# Patient Record
Sex: Male | Born: 1950 | Race: White | Hispanic: No | Smoking: Former smoker
Health system: Southern US, Community
[De-identification: ages and names within clinical notes are randomized; demographics above are authoritative.]

## PROBLEM LIST (undated history)

## (undated) DIAGNOSIS — M1990 Unspecified osteoarthritis, unspecified site: Secondary | ICD-10-CM

## (undated) DIAGNOSIS — K219 Gastro-esophageal reflux disease without esophagitis: Secondary | ICD-10-CM

## (undated) DIAGNOSIS — G4733 Obstructive sleep apnea (adult) (pediatric): Secondary | ICD-10-CM

## (undated) DIAGNOSIS — I10 Essential (primary) hypertension: Secondary | ICD-10-CM

## (undated) DIAGNOSIS — J309 Allergic rhinitis, unspecified: Secondary | ICD-10-CM

## (undated) DIAGNOSIS — Z9989 Dependence on other enabling machines and devices: Secondary | ICD-10-CM

## (undated) DIAGNOSIS — G43909 Migraine, unspecified, not intractable, without status migrainosus: Secondary | ICD-10-CM

## (undated) HISTORY — DX: Gastro-esophageal reflux disease without esophagitis: K21.9

## (undated) HISTORY — DX: Obstructive sleep apnea (adult) (pediatric): G47.33

## (undated) HISTORY — DX: Migraine, unspecified, not intractable, without status migrainosus: G43.909

## (undated) HISTORY — DX: Allergic rhinitis, unspecified: J30.9

## (undated) HISTORY — PX: COLONOSCOPY: SHX174

## (undated) HISTORY — PX: TONSILLECTOMY: SUR1361

## (undated) HISTORY — DX: Dependence on other enabling machines and devices: Z99.89

---
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
## 2002-11-04 ENCOUNTER — Ambulatory Visit (HOSPITAL_COMMUNITY): Admission: RE | Admit: 2002-11-04 | Discharge: 2002-11-04 | Payer: Self-pay | Admitting: Gastroenterology

## 2008-03-17 ENCOUNTER — Ambulatory Visit (HOSPITAL_COMMUNITY): Admission: RE | Admit: 2008-03-17 | Discharge: 2008-03-17 | Payer: Self-pay | Admitting: Orthopedic Surgery

## 2008-06-26 ENCOUNTER — Ambulatory Visit: Payer: Self-pay | Admitting: Internal Medicine

## 2008-06-26 DIAGNOSIS — G4733 Obstructive sleep apnea (adult) (pediatric): Secondary | ICD-10-CM | POA: Insufficient documentation

## 2008-07-12 DIAGNOSIS — K219 Gastro-esophageal reflux disease without esophagitis: Secondary | ICD-10-CM | POA: Insufficient documentation

## 2008-07-12 DIAGNOSIS — J309 Allergic rhinitis, unspecified: Secondary | ICD-10-CM | POA: Insufficient documentation

## 2008-07-12 DIAGNOSIS — E785 Hyperlipidemia, unspecified: Secondary | ICD-10-CM | POA: Insufficient documentation

## 2008-07-20 ENCOUNTER — Encounter: Payer: Self-pay | Admitting: Internal Medicine

## 2008-07-20 ENCOUNTER — Ambulatory Visit (HOSPITAL_BASED_OUTPATIENT_CLINIC_OR_DEPARTMENT_OTHER): Admission: RE | Admit: 2008-07-20 | Discharge: 2008-07-20 | Payer: Self-pay | Admitting: Internal Medicine

## 2008-07-21 ENCOUNTER — Telehealth: Payer: Self-pay | Admitting: Internal Medicine

## 2008-07-30 ENCOUNTER — Ambulatory Visit (HOSPITAL_BASED_OUTPATIENT_CLINIC_OR_DEPARTMENT_OTHER): Admission: RE | Admit: 2008-07-30 | Discharge: 2008-07-30 | Payer: Self-pay | Admitting: Internal Medicine

## 2008-07-30 ENCOUNTER — Encounter: Payer: Self-pay | Admitting: Internal Medicine

## 2008-08-02 ENCOUNTER — Ambulatory Visit: Payer: Self-pay | Admitting: Internal Medicine

## 2008-08-04 ENCOUNTER — Ambulatory Visit: Payer: Self-pay | Admitting: Internal Medicine

## 2008-08-07 ENCOUNTER — Encounter: Payer: Self-pay | Admitting: Internal Medicine

## 2008-08-27 ENCOUNTER — Encounter: Payer: Self-pay | Admitting: Internal Medicine

## 2008-09-12 ENCOUNTER — Ambulatory Visit: Payer: Self-pay | Admitting: Internal Medicine

## 2008-10-01 ENCOUNTER — Encounter: Payer: Self-pay | Admitting: Internal Medicine

## 2008-10-30 ENCOUNTER — Encounter: Payer: Self-pay | Admitting: Internal Medicine

## 2009-06-09 ENCOUNTER — Encounter: Admission: RE | Admit: 2009-06-09 | Discharge: 2009-06-09 | Payer: Self-pay | Admitting: Cardiology

## 2009-07-30 ENCOUNTER — Ambulatory Visit: Payer: Self-pay | Admitting: Internal Medicine

## 2010-07-09 ENCOUNTER — Encounter: Admission: RE | Admit: 2010-07-09 | Discharge: 2010-07-09 | Payer: Self-pay | Admitting: Family Medicine

## 2010-07-10 ENCOUNTER — Emergency Department (HOSPITAL_BASED_OUTPATIENT_CLINIC_OR_DEPARTMENT_OTHER): Admission: EM | Admit: 2010-07-10 | Discharge: 2010-07-10 | Payer: Self-pay | Admitting: Emergency Medicine

## 2010-07-12 ENCOUNTER — Telehealth (INDEPENDENT_AMBULATORY_CARE_PROVIDER_SITE_OTHER): Payer: Self-pay | Admitting: *Deleted

## 2010-07-19 ENCOUNTER — Ambulatory Visit: Payer: Self-pay | Admitting: Internal Medicine

## 2010-07-19 DIAGNOSIS — J811 Chronic pulmonary edema: Secondary | ICD-10-CM | POA: Insufficient documentation

## 2010-07-20 ENCOUNTER — Telehealth (INDEPENDENT_AMBULATORY_CARE_PROVIDER_SITE_OTHER): Payer: Self-pay | Admitting: *Deleted

## 2010-09-07 NOTE — Progress Notes (Signed)
Summary: dxd with pna-LMTCB x 1  Phone Note Call from Patient Call back at Home Phone [phone]   Caller: Patient Summary of Call: Pt states he was seen on Saturday at [facility] in [facility] and was diagnosed with pna.  He would like to know if he should still be sleeping with cpap with pna and if the cpap could have some bacteria in it that could be causing the pna.  Pt states he is using ivory soap in the reservoir and mask to clean it approx every 1-2 wks.  Dr. Maple Hudson, pls advise.  Thanks! Initial call taken by: Gweneth Dimitri RN,  July 12, 2010 2:59 PM  Follow-up for Phone Call        I am not concerned that the CPAP would cause pneumonia and the filtered air is actually a help.  Follow-up by: Waymon Budge MD,  July 12, 2010 3:03 PM  Additional Follow-up for Phone Call Additional follow up Details #1::        LMOMTCB James Zhang  July 12, 2010 3:28 PM pt aware of dr Roxy Cedar response and has ov with cy next week Additional Follow-up by: Philipp Deputy CMA,  July 13, 2010 5:08 PM

## 2010-09-09 NOTE — Assessment & Plan Note (Signed)
Summary: yearly f/u / cj   Copy to:  SELF Primary Provider/Referring Provider:  Marjory Lies  CC:  Yearly follow up visit-Sleep Apnea; not using CPAP regularly sue to recent PNA x 2 weeks. "sleeping all the time"..  History of Present Illness: 08/04/08-OSA NPSG had confirmed Mild to Moderate OSA, AHI 15.6 0n 07/20/08. Implications, driving, weight and treatment options were discussed by phone and cpap titration accomplished. Excellent control at 16 cwp, AHI 0/hr.  09/12/08- OSA  Wife here Doing very well with cpap. Adapted too it right away. Mask comfortable. Excellent compliance. Settled on a mask that will do. Much less daytime sleepiness. We talked again about medical goals, compliance, comfort. Still breathes some through mouth. Less nocturia. Discussed impact of sleep apnea on BP. Discussed  Weight, difficulty of airway intubation.  July 30, 2009- OSA.............................Marland Kitchenbrother in law here His wife died in her sleep. He uses her machine now as a spare, still set on 16. Humidifer doesn't keep him from getting dry mouth. He had nuclear stress test with Dr Patty Sermons- RBBB.   July 19, 2010-  OSA.. Nurse-CC: Yearly follow up visit-Sleep Apnea; not using CPAP regularly sue to recent PNA x 2 weeks. "sleeping all the time". Cold from grandson turned into a pneumonia on CXR  treated at [facility] and his PCP with [facility]. Cough was mostly dry. Had sweats. Still has head congestion, ears stopped up. Neti pot helps. Finsihed Avelox. Has been unable to tolerated CPAP through this but was using it every night before. Denies any leg pain/ cramp/ swelling.     Preventive Screening-Counseling & Management  Alcohol-Tobacco     Smoking Status: quit     Packs/Day: 1.0     Year Started: age 32     Year Quit: age 56  Current Medications (verified): 1)  Strattera 60 Mg Caps (Atomoxetine Hcl) .... Take 1 By Mouth Two Times A Day 2)  Simvastatin 80 Mg Tabs (Simvastatin) .... Take  1 By Mouth Once Daily 3)  Imitrex 100 Mg Tabs (Sumatriptan Succinate) .... Take 1 As Needed Migraines 4)  Omeprazole 20 Mg  Cpdr (Omeprazole) .... By Mouth Daily. Take One Half Hour Before Eating. 5)  Cpap  16 Sms 6)  Toprol Xl 25 Mg Xr24h-Tab (Metoprolol Succinate) .... Take 1 By Mouth Once Daily 7)  Clarinex 5 Mg Tabs (Desloratadine) .... Take 1 By Mouth Once Daily As Needed 8)  Lisinopril 10 Mg Tabs (Lisinopril) .... Take 1 By Mouth Once Daily  Allergies (verified): No Known Drug Allergies  Past History:  Past Surgical History: Last updated: 06/26/2008 Tonsillectomy Knee cartilage 1068  Family History: Last updated: 06/26/2008 Father- restless legs Mother- arrythmia  Social History: Last updated: 06/26/2008 Patient states former smoker. 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 CPAP 07/30/08- 16cwp/ AHI 0/hr. Pneumonia CAP 2011  Social History: Packs/Day:  1.0  Review of Systems      See HPI       The patient complains of nasal congestion/difficulty breathing through nose.  The patient denies shortness of breath with activity, shortness of breath at rest, productive cough, non-productive cough, coughing up blood, chest pain, irregular heartbeats, acid heartburn, indigestion, loss of appetite, weight change, abdominal pain, difficulty swallowing, sore throat, tooth/dental problems, headaches, and sneezing.  Denies syncope, palpitation, calf pain. Feels sleepy now.   Vital Signs:  Patient profile:   60 year old male Height:      70 inches Weight:      255 pounds BMI:     36.72 O2 Sat:      96 % on Room air Pulse rate:   86 / minute BP sitting:   90 / 52  (left arm) Cuff  size:   large  Vitals Entered By: Reynaldo Minium CMA (July 19, 2010 2:26 PM)  O2 Flow:  Room air CC: Yearly follow up visit-Sleep Apnea; not using CPAP regularly sue to recent PNA x 2 weeks. "sleeping all the time".   Physical Exam  Additional Exam:  General: A/Ox3; pleasant and cooperative, NAD, obese   96 % sat on RA. Note BP 90/52. At home his own cuff gets  SKIN: no rash, lesions NODES: no lymphadenopathy HEENT: [place]/AT, EOM- WNL, Conjuctivae- clear, PERRLA, TM-WNL, Nose- clear, Throat- clear and wnl, Mallampati  III-IV NECK: Supple w/ fair ROM, JVD- none, normal carotid impulses w/o bruits Thyroid- normal to palpation CHEST: Clear to P&A, no cough, rales or rhonchi HEART: RRR, no m/g/r heard ABDOMEN: Soft and nl; nml bowel sounds; no organomegaly or masses noted YNW:GNFA, nl pulses, no edema  NEURO: Grossly intact to observation      Impression & Recommendations:  Problem # 1:  SLEEP APNEA (ICD-780.57)  Encourage resumption of regular CPAP as he recovers from the pneumonia. He has residual rhinitis with head congestion and we will let him try a neb here.   Problem # 2:  PULMONARY CONGESTION AND HYPOSTASIS (ICD-514) Recent acute illness treated as pneumonia. He is improving now.   Medications Added to Medication List This Visit: 1)  Lisinopril 10 Mg Tabs (Lisinopril) .... Take 1 by mouth once daily  Other Orders: Est. Patient Level IV (21308) Nebulizer Tx (65784)  Patient Instructions: 1)  Please schedule a follow-up appointment in 6 months. 2)  Please try to get back on your CPAP. If you have trouble with it let us know.  3)  Neb neo nasal 4)  Consider otc decongestant phenylephrine/ PE (Sudafed-PE and others).      Medication Administration  Medication # 1:    Medication: EMR miscellaneous medications    Diagnosis: ALLERGIC RHINITIS (ICD-477.9)    Dose: 3 drops    Route: intranasal    Exp Date: 11/2011    Lot #: [id number]    Mfr: Bayer    Comments:  Neo-Synephrine    Patient tolerated medication without complications    Given by: Reynaldo Minium CMA (July 19, 2010 2:55 PM)  Orders Added: 1)  Est. Patient Level IV [84132] 2)  Nebulizer Tx [phone]

## 2010-09-09 NOTE — Progress Notes (Signed)
Summary: talk to nurse- returning call  Phone Note Call from Patient Call back at Home Phone [phone]   Caller: Patient Call For: James Zhang Summary of Call: Pt states he saw CY yesterday and he was supposed to give him the name of OTC decongestant and he was supposed to give him something for the yeast in his throat pls advise.//[facility] [address] Initial call taken by: Darletta Moll,  July 20, 2010 8:20 AM  Follow-up for Phone Call        Per pt instructions from yesterday, pt should consider OTC decongestant phenylphrine/PE (Sudafed-PE and others). [facility]  Crystal Jones RN  July 20, 2010 9:15 AM   Additional Follow-up for Phone Call Additional follow up Details #1::        Returning triage's call.   James Zhang  July 20, 2010 2:53 PM    Additional Follow-up for Phone Call Additional follow up Details #2::    Spoke with pt.  I advised that CDY wanted him to try sudafed PE OTC.  He verbalized understanding.  He states that CDY had also mentioned that he saw some yeast in the back of his throat and wanted him to have something for that.  Nothing was sent to pharm.  Pls advise thanks! Follow-up by: Vernie Murders,  July 20, 2010 4:18 PM  Additional Follow-up for Phone Call Additional follow up Details #3:: Details for Additional Follow-up Action Taken: Per CDY-give Dukes Magic Mouthwash #156ml swish and swallow 1 tablespoon three times a day no refills.Reynaldo Minium CMA  July 20, 2010 5:23 PM     Pt aware that RX was called in to pharmacy.Reynaldo Minium CMA  July 20, 2010 5:23 PM   New/Updated Medications: FIRST-DUKES MOUTHWASH  SUSP (DIPHENHYD-HYDROCORT-NYSTATIN) swish and swallow 1 tablespoon three times a day Prescriptions: FIRST-DUKES MOUTHWASH  SUSP (DIPHENHYD-HYDROCORT-NYSTATIN) swish and swallow 1 tablespoon three times a day  #140ml x 0   Entered by:   Reynaldo Minium CMA   Authorized by:   Waymon Budge MD   Signed by:   Reynaldo Minium CMA on  07/20/2010   Method used:   Telephoned to ...       [facility] [address] [facility]ail[facility]       [address]       [place], [place]  [address]       Ph: [phone] or [phone]       Fax: [phone]   RxID:   [id number]

## 2010-10-19 LAB — DIFFERENTIAL
Basophils Relative: 1 % (ref 0–1)
Lymphocytes Relative: 8 % — ABNORMAL LOW (ref 12–46)
Lymphs Abs: 0.6 10*3/uL — ABNORMAL LOW (ref 0.7–4.0)
Neutrophils Relative %: 79 % — ABNORMAL HIGH (ref 43–77)

## 2010-10-19 LAB — CULTURE, BLOOD (ROUTINE X 2)
Culture: NO GROWTH
Culture: NO GROWTH

## 2010-10-19 LAB — URINALYSIS, ROUTINE W REFLEX MICROSCOPIC
Bilirubin Urine: NEGATIVE
Glucose, UA: NEGATIVE mg/dL
Hgb urine dipstick: NEGATIVE
Ketones, ur: NEGATIVE mg/dL
Specific Gravity, Urine: 1.029 (ref 1.005–1.030)
Urobilinogen, UA: 1 mg/dL (ref 0.0–1.0)
pH: 5.5 (ref 5.0–8.0)

## 2010-10-19 LAB — CBC
MCH: 32.1 pg (ref 26.0–34.0)
Platelets: 219 10*3/uL (ref 150–400)
RBC: 5.16 MIL/uL (ref 4.22–5.81)
RDW: 12.4 % (ref 11.5–15.5)

## 2010-10-19 LAB — BASIC METABOLIC PANEL
BUN: 20 mg/dL (ref 6–23)
GFR calc Af Amer: >60 mL/min (ref >60)
GFR calc non Af Amer: >60 mL/min (ref >60)

## 2010-10-27 IMAGING — CR DG CHEST 2V
2 series · 2 of 2 positions shown · non-contrast
Comparison: None.

CLINICAL DATA: Abnormal ekg.  No chest complaints.

CHEST - 2 VIEW

[w chest pa]
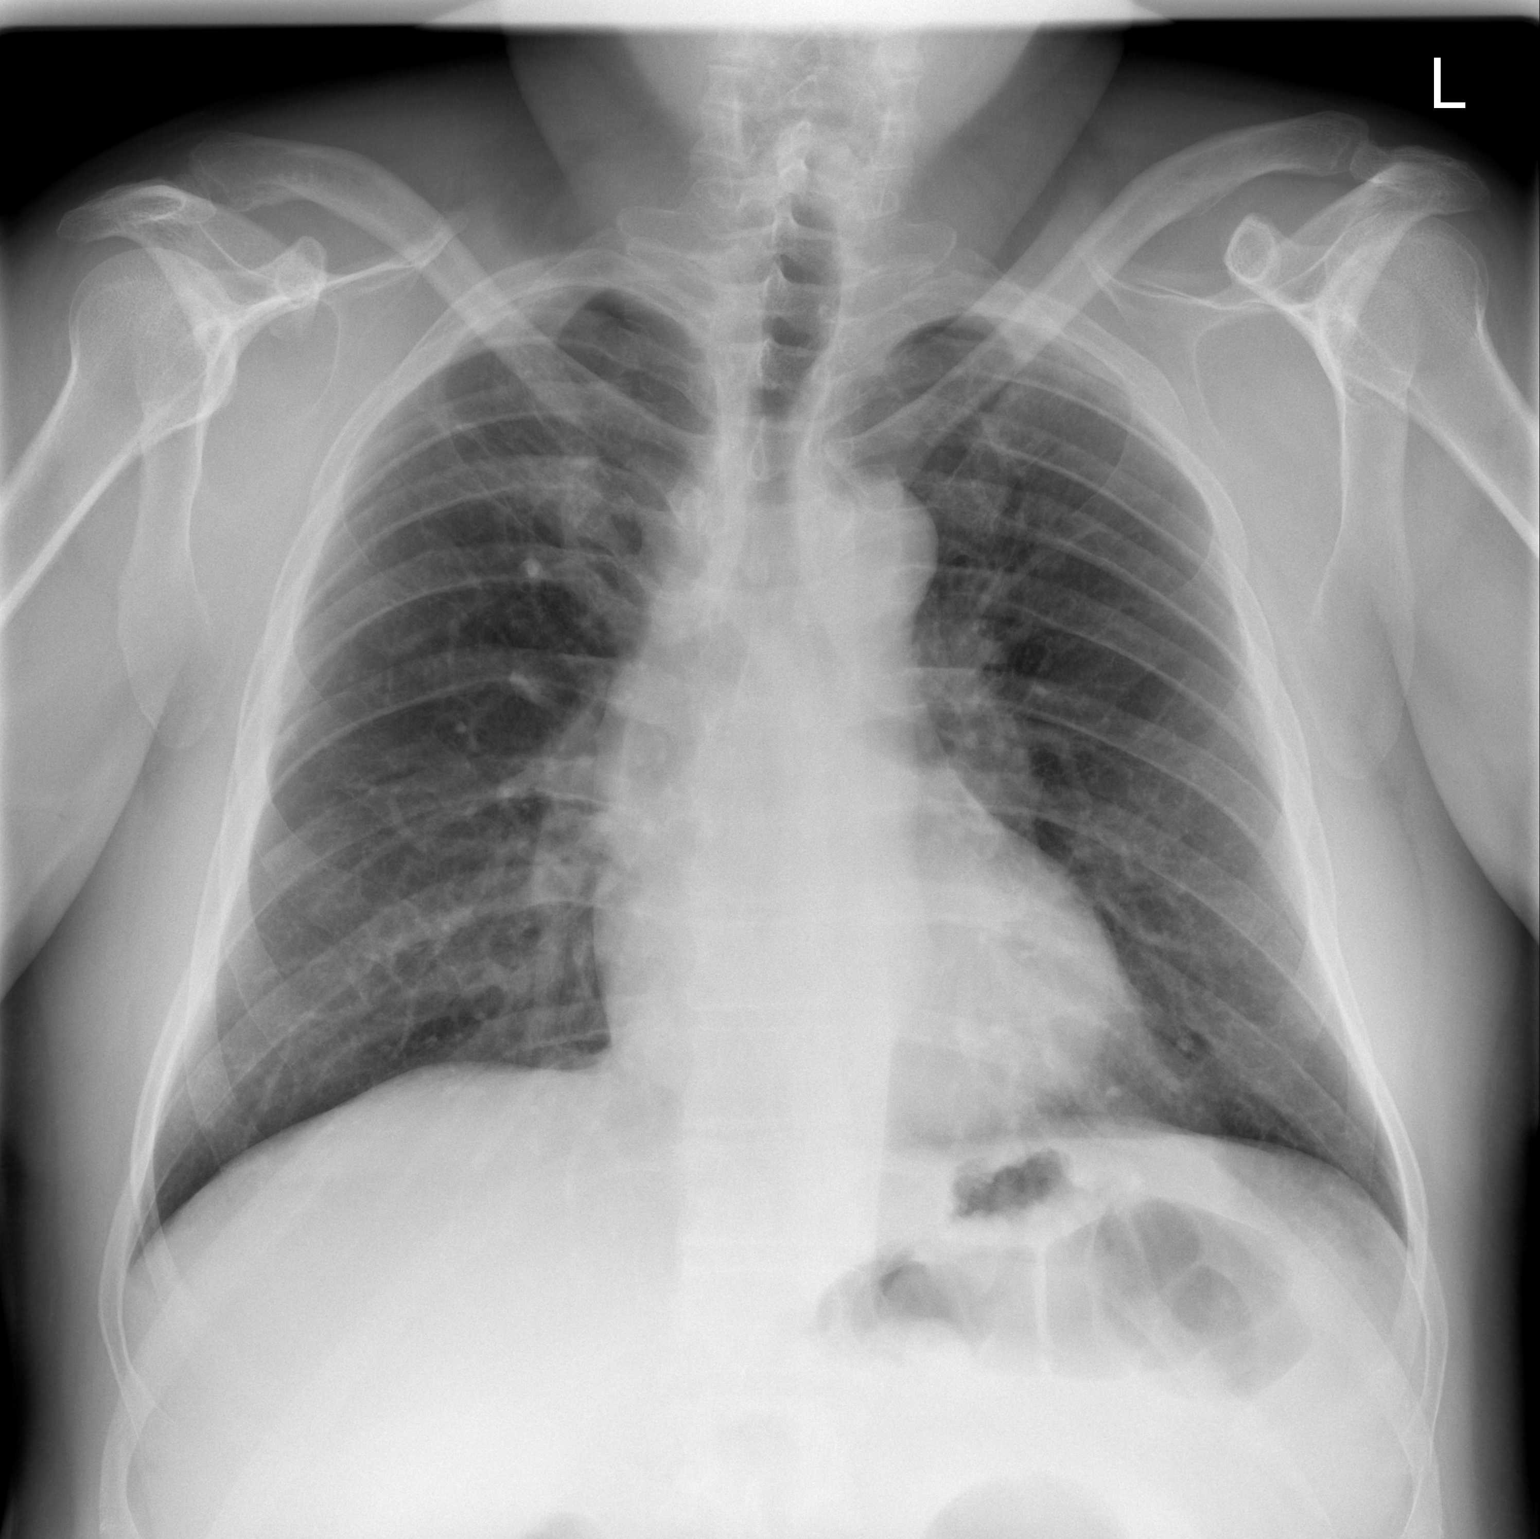

[w chest lat]
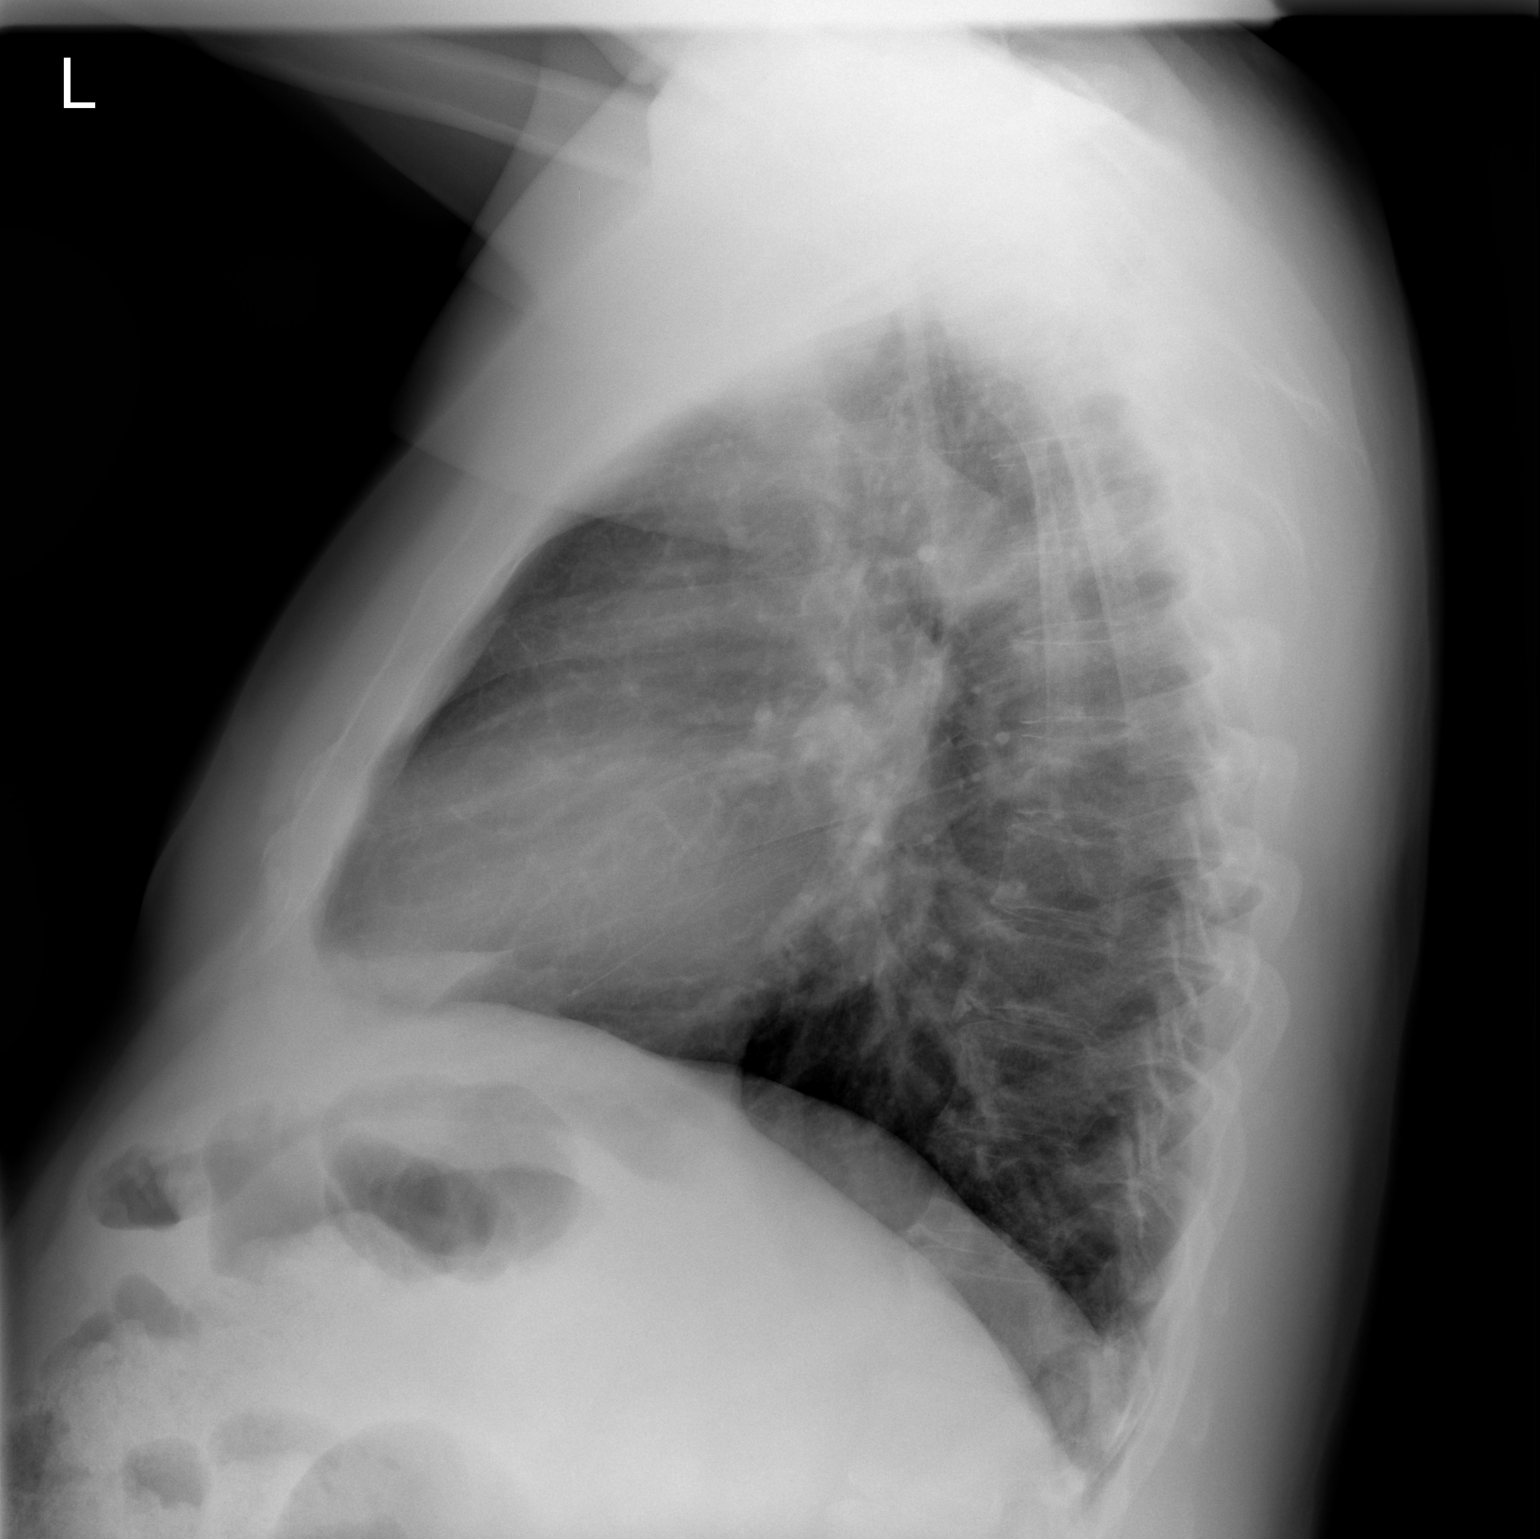

[2 of 2 positions shown; findings below may reference images not displayed]

FINDINGS: Lungs are clear.  Heart size is normal.  Mediastinum,
hila, pleura and osseous structures appear normal for age.
IMPRESSION: No active cardiopulmonary disease.

## 2010-12-21 NOTE — Procedures (Signed)
James Zhang, James Zhang             ACCOUNT NO.:  [id number]   MEDICAL RECORD NO.:  [id number]          PATIENT TYPE:  OUT   LOCATION:  SLEEP CENTER                 FACILITY[facility]  [facility]   PHYSICIAN:  Clinton D. Maple Hudson, MD, FCCP, FACPDATE OF BIRTH:  June 23[birth year] 1950   DATE OF STUDY:                            NOCTURNAL POLYSOMNOGRAM   REFERRING PHYSICIAN:   DATE OF STUDY:  July 30, 2008.   INDICATION FOR STUDY:  Hypersomnia with sleep apnea.   EPWORTH SLEEPINESS SCORE:  Epworth sleepiness score 8/24.  BMI 34.2.  Weight 245 pounds.  Height 71 inches.  Neck 16.5 inches.   MEDICATIONS:  Home medications are charted and reviewed.  A baseline  diagnostic NPSG on July 20, 2008, recorded an AHI of 15.6 per hour.  CPAP titration is requested.   SLEEP ARCHITECTURE:  Total sleep time 324 minutes with sleep efficiency  90.4%.  Stage I was 5.9%.  Stage II 83.4%.  Stage III was absent.  REM  10.8% of total sleep time.  Sleep latency 4 minutes.  REM latency 175  minutes.  Wake after sleep onset 30 minutes.  Arousal index 20.5.  No  bedtime medication was taken.   RESPIRATORY DATA:  CPAP titration protocol.  CPAP was titrated to 16  CWP, AHI zero per hour.  He chose a medium ResMed Quattro mask with  heated humidifier.   OXYGEN DATA:  Moderate snoring before CPAP control with oxygen  saturation at a mean of 93.7% on room air with CPAP.   CARDIAC DATA:  Sinus rhythm with PVCs and PACs.   MOVEMENT-PARASOMNIA:  Periodic limb movement with a total of 156 limb  jerks of which 36 were associated with arousal or awakening for a  periodic limb movement with arousal index of 6.7 per hour.  One bathroom  trip.   IMPRESSIONS-RECOMMENDATIONS:  1. Successful continuous positive airway pressure titration to 16      centimeters of water pressure, apnea-hypopnea index zero per hour.      He chose a medium ResMed Quattro full face mask with heated      humidifier.  2. Baseline diagnostic nocturnal  polysomnogram on July 20, 2008,      recorded an apnea-hypopnea index of 15.6 per hour.  3. Frequent premature ventricular contractions and premature atrial      contractions.  4. Periodic limb movement with arousal, 6.7 per hour.      Clinton D. Maple Hudson, MD, [facility], FACP  Diplomate, Biomedical engineer of Sleep Medicine  Electronically Signed     CDY/MEDQ  D:  08/03/2008 11:01:14  T:  08/04/2008 01:23:56  Job:  [id number]

## 2010-12-21 NOTE — Procedures (Signed)
James Zhang, NOSAL             ACCOUNT NO.:  [id number]   MEDICAL RECORD NO.:  [id number]          PATIENT TYPE:  OUT   LOCATION:  SLEEP CENTER                 FACILITY:  [facility]   PHYSICIAN:  Clinton D. Maple Hudson, MD, FCCP, FACPDATE OF BIRTH:  01[birth year]20/1950   DATE OF STUDY:  07/20/2008                            NOCTURNAL POLYSOMNOGRAM   REFERRING PHYSICIAN:   REFERRING PHYSICIAN:  Jetty Duhamel, MD, FCCP.   DATE OF STUDY:  July 20, 2008.   INDICATION FOR STUDY:  Hypersomnia with sleep apnea.   EPWORTH SLEEPINESS SCORE:  Epworth sleepiness score  8/24.  BMI 34.2,  weight 245 pounds, height 71 inches, and neck 17.5 inches.   MEDICATIONS:  Home medications are charted and reviewed.   SLEEP ARCHITECTURE:  Total sleep time 357 minutes with sleep efficiency  78.7%.  Stage I was 8.4%, stage II 88%, stage III absent, REM 3.6% of  total sleep time.  Sleep latency 36 minutes.  REM latency 237 minutes.  Awake after sleep onset 58 minutes.  Arousal index 11.8.  No bedtime  medication was taken.   RESPIRATORY DATA:  Apnea-hypopnea index (AHI) 15.6 per hour.  A total of  93 events was scored including 2 obstructive apneas, one central apnea,  and 90 hypopneas.  Events were mostly associated with supine sleep  position.  REM AHI 32.3 per hour.  There was insufficient early events  to permit CPAP titration by split protocol.   OXYGEN DATA:  Moderately loud snoring with oxygen desaturation to a  nadir of 77%.  Mean oxygen saturation through the study was 92.1% on  room air.   CARDIAC DATA:  Frequent PVCs.   MOVEMENT-PARASOMNIA:  Occasional limb jerk with arousal, 0.8 per hour.  Bathroom x1.   IMPRESSIONS-RECOMMENDATIONS:  1. Mild-to-moderate obstructive sleep apnea/hypopnea syndrome, AHI      15.6 per hour with events associated with supine sleep position.      Moderately loud snoring and oxygen desaturation to a nadir of 77%.  2. He did not meet protocol criteria for CPAP titration by  split study      on the study night.  Consider return for      CPAP titration or evaluate for alternative management as      appropriate.  3. Occasional limb jerk with arousal, 0.8 per hour, of doubtful      significance.      Clinton D. Maple Hudson, MD, [facility], FACP  Diplomate, Biomedical engineer of Sleep Medicine  Electronically Signed     CDY/MEDQ  D:  07/26/2008 14:15:45  T:  07/27/2008 05:36:14  Job:  [id number]

## 2010-12-24 NOTE — Op Note (Signed)
NAMECREEK, GAN                         ACCOUNT NO.:  [id number]   MEDICAL RECORD NO.:  [id number]                   PATIENT TYPE:  AMB   LOCATION:  ENDO                                 FACILITY:  [facility]   PHYSICIAN:  Anselmo Rod, M.D.               DATE OF BIRTH:  1950-11-21   DATE OF PROCEDURE:  11/04/2002  DATE OF DISCHARGE:                                 OPERATIVE REPORT   PROCEDURE PERFORMED:  Colonoscopy.   ENDOSCOPIST:  Charna Elizabeth, M.D.   INSTRUMENT USED:  Olympus video colonoscope.   INDICATIONS FOR PROCEDURE:  Rectal bleeding in a 60 year old white male.  Rule out colonic polyps, masses, hemorrhoids, etc.   PREPROCEDURE PREPARATION:  Informed consent was procured from the patient.  The patient was fasted for eight hours prior to the procedure and prepped  with a bottle of magnesium citrate and a gallon of GoLYTELY the night prior  to the procedure.  The patient claimed he consumed his entire prep.   PREPROCEDURE PHYSICAL:  The patient had stable vital signs.  Neck supple.  Chest clear to auscultation.  S1 and S2 regular.  Abdomen soft with normal  bowel sounds.   DESCRIPTION OF PROCEDURE:  The patient was placed in left lateral decubitus  position and sedated with 75 mg of Demerol and 7 mg of Versed intravenously.  Once the patient was adequately sedated and maintained on low flow oxygen  and continuous cardiac monitoring, the Olympus video colonoscope was  advanced from the rectum to the cecum with extreme difficulty.  There was a  large amount of solid stool in the dependent area of the colon.  Multiple  washes were done.  The patient's position was changed from the left lateral  to the supine and then the right lateral position to reach the cecal base.  The appendicular orifice and ileocecal valve were clearly visualized and  photographed.  No masses, polyps, erosions, ulcerations or diverticula were  seen.  Small lesions could have been missed.   Retroflexion in the rectum  revealed moderate-sized internal hemorrhoids that were not bleeding at the  time of examination.   IMPRESSION:  1. Unrevealing colonoscopy except for moderate-sized nonbleeding internal     hemorrhoids.  2. Large amount of residual stool in the colon, small lesions could have     been missed.  3. Terminal ileum not visualized.             RECOMMENDATIONS:  1. Anusol HC 2.5% suppositories have been advised if the patient's rectal     bleeding continues.  2. A high fiber diet with liberal fluid intake.  3. Outpatient follow-up in the next two weeks for further recommendation.  Anselmo Rod, M.D.    JNM/MEDQ  D:  11/04/2002  T:  11/04/2002  Job:  [id number]   cc:   Marjory Lies, M.D.  [address]  [place]  [place] [address]  Fax: [phone]

## 2011-01-05 ENCOUNTER — Other Ambulatory Visit: Payer: Self-pay | Admitting: Cardiology

## 2011-01-05 MED ORDER — METOPROLOL TARTRATE 25 MG PO TABS: 25.0000 mg | ORAL_TABLET | Freq: Every day | ORAL | Status: DC

## 2011-01-05 NOTE — Telephone Encounter (Signed)
[facility] He is currently following with primary doctor Marjory Lies. Dr Patty Sermons aware and feels pt can get his metoprolol and lisinopril from his primary. Pt verbalizes understanding. Informed him we would fill with 30 day supply until he has resolved this matter and follows up with Dr Doristine Counter

## 2011-01-10 ENCOUNTER — Other Ambulatory Visit: Payer: Self-pay | Admitting: Cardiology

## 2011-01-10 DIAGNOSIS — I10 Essential (primary) hypertension: Secondary | ICD-10-CM

## 2011-01-10 NOTE — Telephone Encounter (Signed)
Left message

## 2011-01-10 NOTE — Telephone Encounter (Signed)
PT SAID PICKED UP  A SCRIPT THAT WAS DONE LAST WEEK AND IT IS DIFFERENT THAN WHAT HE HAS BEEN TAKING. CHART IN BOX.

## 2011-01-12 MED ORDER — METOPROLOL SUCCINATE ER 25 MG PO TB24: 25.0000 mg | ORAL_TABLET | Freq: Every day | ORAL | Status: DC

## 2011-01-12 NOTE — Telephone Encounter (Signed)
Refilled for metoprolol tartrate instead of succinate.

## 2011-01-14 ENCOUNTER — Encounter: Payer: Self-pay | Admitting: Internal Medicine

## 2011-01-17 ENCOUNTER — Ambulatory Visit: Payer: Self-pay | Admitting: Internal Medicine

## 2011-03-09 ENCOUNTER — Other Ambulatory Visit: Payer: Self-pay | Admitting: *Deleted

## 2011-03-09 DIAGNOSIS — I10 Essential (primary) hypertension: Secondary | ICD-10-CM

## 2011-03-09 MED ORDER — METOPROLOL SUCCINATE ER 25 MG PO TB24: 25.0000 mg | ORAL_TABLET | Freq: Every day | ORAL | Status: DC

## 2011-03-09 NOTE — Telephone Encounter (Signed)
Refilled [facility]oprolol re- [facility] [address]

## 2011-03-16 ENCOUNTER — Ambulatory Visit (INDEPENDENT_AMBULATORY_CARE_PROVIDER_SITE_OTHER): Payer: BC Managed Care – PPO | Admitting: Internal Medicine

## 2011-03-16 ENCOUNTER — Encounter: Payer: Self-pay | Admitting: Internal Medicine

## 2011-03-16 VITALS — BP 130/68 | HR 59 | Ht 70.0 in | Wt 256.0 lb

## 2011-03-16 DIAGNOSIS — G4730 Sleep apnea, unspecified: Secondary | ICD-10-CM

## 2011-03-16 DIAGNOSIS — J811 Chronic pulmonary edema: Secondary | ICD-10-CM

## 2011-03-16 MED ORDER — METHYLPHENIDATE HCL ER (LA) 30 MG PO CP24: 30.0000 mg | ORAL_CAPSULE | Freq: Every day | ORAL | Status: DC

## 2011-03-16 NOTE — Progress Notes (Signed)
Subjective:    Patient ID: James Zhang, male    DOB: 1951-07-01, 60 y.o.   MRN: [id number]  HPI 03/16/11- 35 yo former smoker followed for obstructive sleep apnea complicated by allergic rhinitis, GERD Last here 07/19/2010 [name] well- feels still sleepy during the day. Wife died, he is alone with no observer. Feels he is sleeping at night ok, w/o waking after sleep onset. He stopped Strattera because of cost, around first of year. That is when the sleepiness started. Continues CPAP 16 all night every night/ SMS He has been on Toprol since 2010. Was on Adderall remotely, but Strattera better helped him "focus".    Review of Systems Constitutional:   No-   weight loss, night sweats, fevers, chills,            + fatigue, lassitude. HEENT:   No-  headaches, difficulty swallowing, tooth/dental problems, sore throat,       No-  sneezing, itching, ear ache, nasal congestion, post nasal drip,  CV:  No-   chest pain, orthopnea, PND, swelling in lower extremities, anasarca, dizziness, palpitations Resp: No-   shortness of breath with exertion or at rest.              No-   productive cough,  No non-productive cough,  No-  coughing up of blood.              No-   change in color of mucus.  No- wheezing.   Skin: No-   rash or lesions. GI:  No-   heartburn, indigestion, abdominal pain, nausea, vomiting, diarrhea,                 change in bowel habits, loss of appetite GU: No-   dysuria, change in color of urine, no urgency or frequency.  No- flank pain. MS:  No-   joint pain or swelling.  No- decreased range of motion.  No- back pain. Neuro- grossly normal to observation, Or:  Psych:  No- change in mood or affect. No depression or anxiety.  No memory loss.      Objective:   Physical Exam General- Alert, Oriented, Affect-appropriate, Distress- none acute  Calm and appropriate Skin- rash-none, lesions- none, excoriation- none Lymphadenopathy- none Head- atraumatic            Eyes- Gross vision  intact, PERRLA, conjunctivae clear secretions            Ears- Hearing, canals normal            Nose- Clear, no-Septal dev, mucus, polyps, erosion, perforation             Throat- Mallampati III , mucosa clear , drainage- none, tonsils- atrophic Neck- flexible , trachea midline, no stridor , thyroid nl, carotid no bruit Chest - symmetrical excursion , unlabored           Heart/CV- RRR , no murmur , no gallop  , no rub, nl s1 s2                           - JVD- none , edema- none, stasis changes- none, varices- none           Lung- clear to P&A, wheeze- none, cough- none , dullness-none, rub- none           Chest wall-  Abd- tender-no, distended-no, bowel sounds-present, HSM- no Br/ Gen/ Rectal- Not done, not indicated Extrem- cyanosis- none, clubbing, none, atrophy-  none, strength- nl Neuro- grossly intact to observation         Assessment & Plan:

## 2011-03-16 NOTE — Patient Instructions (Signed)
Order- PCC- SMS- increase CPAP to 18 cwp  Script generic ritalin 30 mg LA    1 daily as needed  CC Dr Doristine Counter

## 2011-03-16 NOTE — Assessment & Plan Note (Addendum)
Persistent daytime somnolence despite apparent good control from CPAP at 16. We can try moving CPAP up a little, but will also script ritalin with discussion. He might be describing effect of depression following the death of his wife.

## 2011-03-17 ENCOUNTER — Encounter: Payer: Self-pay | Admitting: Cardiology

## 2011-04-04 ENCOUNTER — Telehealth: Payer: Self-pay | Admitting: Internal Medicine

## 2011-04-04 MED ORDER — METHYLPHENIDATE HCL ER (LA) 30 MG PO CP24: 30.0000 mg | ORAL_CAPSULE | Freq: Every day | ORAL | Status: DC

## 2011-04-04 NOTE — Telephone Encounter (Signed)
Rx signed and mailed as requested.  

## 2011-04-04 NOTE — Telephone Encounter (Signed)
Rx was printed and placed on CDY's cart to be signed with envelope.

## 2011-05-16 ENCOUNTER — Telehealth: Payer: Self-pay | Admitting: Internal Medicine

## 2011-05-16 MED ORDER — METHYLPHENIDATE HCL ER (LA) 30 MG PO CP24: 30.0000 mg | ORAL_CAPSULE | Freq: Every day | ORAL | Status: DC

## 2011-05-16 NOTE — Telephone Encounter (Signed)
I spoke with pt and he is requesting his rx for ritalin and would like this picked up. Rx has been printed and is awaiting cy signature. Pt states he will pick up this rx tomorrow morning and does not need a call back. Please advise Dr. Maple Hudson thanks  Carver Fila, CMA

## 2011-06-23 ENCOUNTER — Telehealth: Payer: Self-pay | Admitting: Internal Medicine

## 2011-06-23 MED ORDER — METHYLPHENIDATE HCL ER (LA) 30 MG PO CP24: 30.0000 mg | ORAL_CAPSULE | Freq: Every day | ORAL | Status: DC

## 2011-06-23 NOTE — Telephone Encounter (Signed)
rx signed by CY.  Called and spoke with pt. Informed him rx ready.  Pt will pick up rx at front desk tomorrow.

## 2011-06-23 NOTE — Telephone Encounter (Signed)
Pt requesting refill on Ritalin.  Last seen 03/16/11 and pending appt with CY for 09/16/11.  Pt last received rx for this on 05/16/11 for # 30 x 0 refills.  Rx printed and put on cy's cart for him to sign.

## 2011-08-04 ENCOUNTER — Telehealth: Payer: Self-pay | Admitting: Internal Medicine

## 2011-08-04 MED ORDER — METHYLPHENIDATE HCL ER (LA) 30 MG PO CP24: 30.0000 mg | ORAL_CAPSULE | Freq: Every day | ORAL | Status: DC

## 2011-08-04 NOTE — Telephone Encounter (Signed)
rx has been printed by CY and pt is aware that rx will be up front to be picked up.

## 2011-09-16 ENCOUNTER — Encounter: Payer: Self-pay | Admitting: Internal Medicine

## 2011-09-16 ENCOUNTER — Ambulatory Visit (INDEPENDENT_AMBULATORY_CARE_PROVIDER_SITE_OTHER): Payer: BC Managed Care – PPO | Admitting: Internal Medicine

## 2011-09-16 VITALS — BP 112/62 | HR 64 | Ht 70.0 in | Wt 260.4 lb

## 2011-09-16 DIAGNOSIS — J309 Allergic rhinitis, unspecified: Secondary | ICD-10-CM

## 2011-09-16 DIAGNOSIS — G4733 Obstructive sleep apnea (adult) (pediatric): Secondary | ICD-10-CM

## 2011-09-16 MED ORDER — METHYLPHENIDATE HCL ER (LA) 30 MG PO CP24: 30.0000 mg | ORAL_CAPSULE | Freq: Every day | ORAL | Status: DC

## 2011-09-16 NOTE — Progress Notes (Addendum)
Patient ID: James Zhang, male    DOB: 12/08/1950, 62 y.o.   MRN: [id number]  HPI 03/16/11- 46 yo former smoker followed for obstructive sleep apnea complicated by allergic rhinitis, GERD Last here 07/19/2010 [name] well- feels still sleepy during the day. Wife died, he is alone with no observer. Feels he is sleeping at night ok, w/o waking after sleep onset. He stopped Strattera because of cost, around first of year. That is when the sleepiness started. Continues CPAP 16 all night every night/ SMS He has been on Toprol since 2010. Was on Adderall remotely, but Strattera better helped him "focus".   09/16/11- 21 yo former smoker followed for obstructive sleep apnea complicated by allergic rhinitis, GERD Uses CPAP 18 every night approx 8 hours; started ritalin and wondering if needs higher dose-cant tell a big difference only slight change When he sleeps on his back his mouth gets dry, apparently because he drops his mouth open despite full facemask. We discussed use of humidifier. He is sleepy on first waking and again in the afternoon. He does get outdoors in the morning, walking his dogs, with light exposure. Short sleep onset. Unaware of waking after sleep onset. Asks flu vaccine, even though late- discussed.    Review of Systems- see HPI Constitutional:   No-   weight loss, night sweats, fevers, chills,            + fatigue, lassitude. HEENT:   No-  headaches, difficulty swallowing, tooth/dental problems, sore throat,       No-  sneezing, itching, ear ache, nasal congestion, post nasal drip,  CV:  No-   chest pain, orthopnea, PND, swelling in lower extremities, anasarca, dizziness, palpitations Resp: No-   shortness of breath with exertion or at rest.              No-   productive cough,  No non-productive cough,  No-  coughing up of blood.              No-   change in color of mucus.  No- wheezing.   Skin: No-   rash or lesions. GI:  No-   heartburn, indigestion, abdominal pain, nausea,  vomiting, diarrhea,                 change in bowel habits, loss of appetite GU:  MS:  No-   joint pain or swelling.  No- decreased range of motion.  No- back pain. Neuro- grossly normal to observation, Or:  Psych:  No- change in mood or affect. No depression or anxiety.  No memory loss.      Objective:   Physical Exam General- Alert, Oriented, Affect-appropriate, Distress- none acute  Calm and appropriate, overweight Skin- rash-none, lesions- none, excoriation- none Lymphadenopathy- none Head- atraumatic            Eyes- Gross vision intact, PERRLA, conjunctivae clear secretions            Ears- Hearing, canals normal            Nose- Clear, no-Septal dev, mucus, polyps, erosion, perforation             Throat- Mallampati III , mucosa clear , drainage- none, tonsils- atrophic Neck- flexible , trachea midline, no stridor , thyroid nl, carotid no bruit Chest - symmetrical excursion , unlabored           Heart/CV- RRR , no murmur , no gallop  , no rub, nl s1 s2                           -  JVD- none , edema- none, stasis changes- none, varices- none           Lung- clear to P&A, wheeze- none, cough- none , dullness-none, rub- none           Chest wall-  Abd- Br/ Gen/ Rectal- Not done, not indicated Extrem- cyanosis- none, clubbing, none, atrophy- none, strength- nl Neuro- grossly intact to observation

## 2011-09-16 NOTE — Patient Instructions (Addendum)
Consider occasional caffeine caplet  Order - [facility] please ask SMS what is current CPAP pressure?  Try pinning a sock with tennis ball to the back of a tee shirt to keep you off your back while sleeping  Try a chin strap from drug store, to help keep your mouth closed.  You can also try Biotene  Try taking the ritalin when you first wake in the morning instead of waiting till breakfast  Flu vax

## 2011-09-20 NOTE — Assessment & Plan Note (Signed)
Discussed ways to increase hydration of the nasal mucosa.

## 2011-09-20 NOTE — Assessment & Plan Note (Signed)
We discussed CPAP pressure and goals. Because of residual daytime sleepiness, suggested caffeine tablet. Plan-increase CPAP to 18. Try chin strap for snoring. May need to increase Ritalin dose. Consider MSLT.

## 2011-09-21 ENCOUNTER — Telehealth: Payer: Self-pay | Admitting: *Deleted

## 2011-09-22 NOTE — Telephone Encounter (Signed)
Noted  

## 2011-10-25 ENCOUNTER — Encounter: Payer: Self-pay | Admitting: *Deleted

## 2011-10-25 ENCOUNTER — Telehealth: Payer: Self-pay | Admitting: Internal Medicine

## 2011-10-25 MED ORDER — METHYLPHENIDATE HCL ER (LA) 30 MG PO CP24: 30.0000 mg | ORAL_CAPSULE | Freq: Every day | ORAL | Status: DC

## 2011-10-25 NOTE — Telephone Encounter (Signed)
Pt aware Rx at front for pick up.

## 2011-10-25 NOTE — Telephone Encounter (Signed)
Rx was printed and placed on CDY's cart to sign.

## 2011-11-27 IMAGING — CR DG CHEST 2V
2 series · 2 of 2 positions shown · non-contrast
Comparison: Chest x-ray of 07/09/2010

CLINICAL DATA: Increase in cough, chest pain

CHEST - 2 VIEW

[w chest pa]
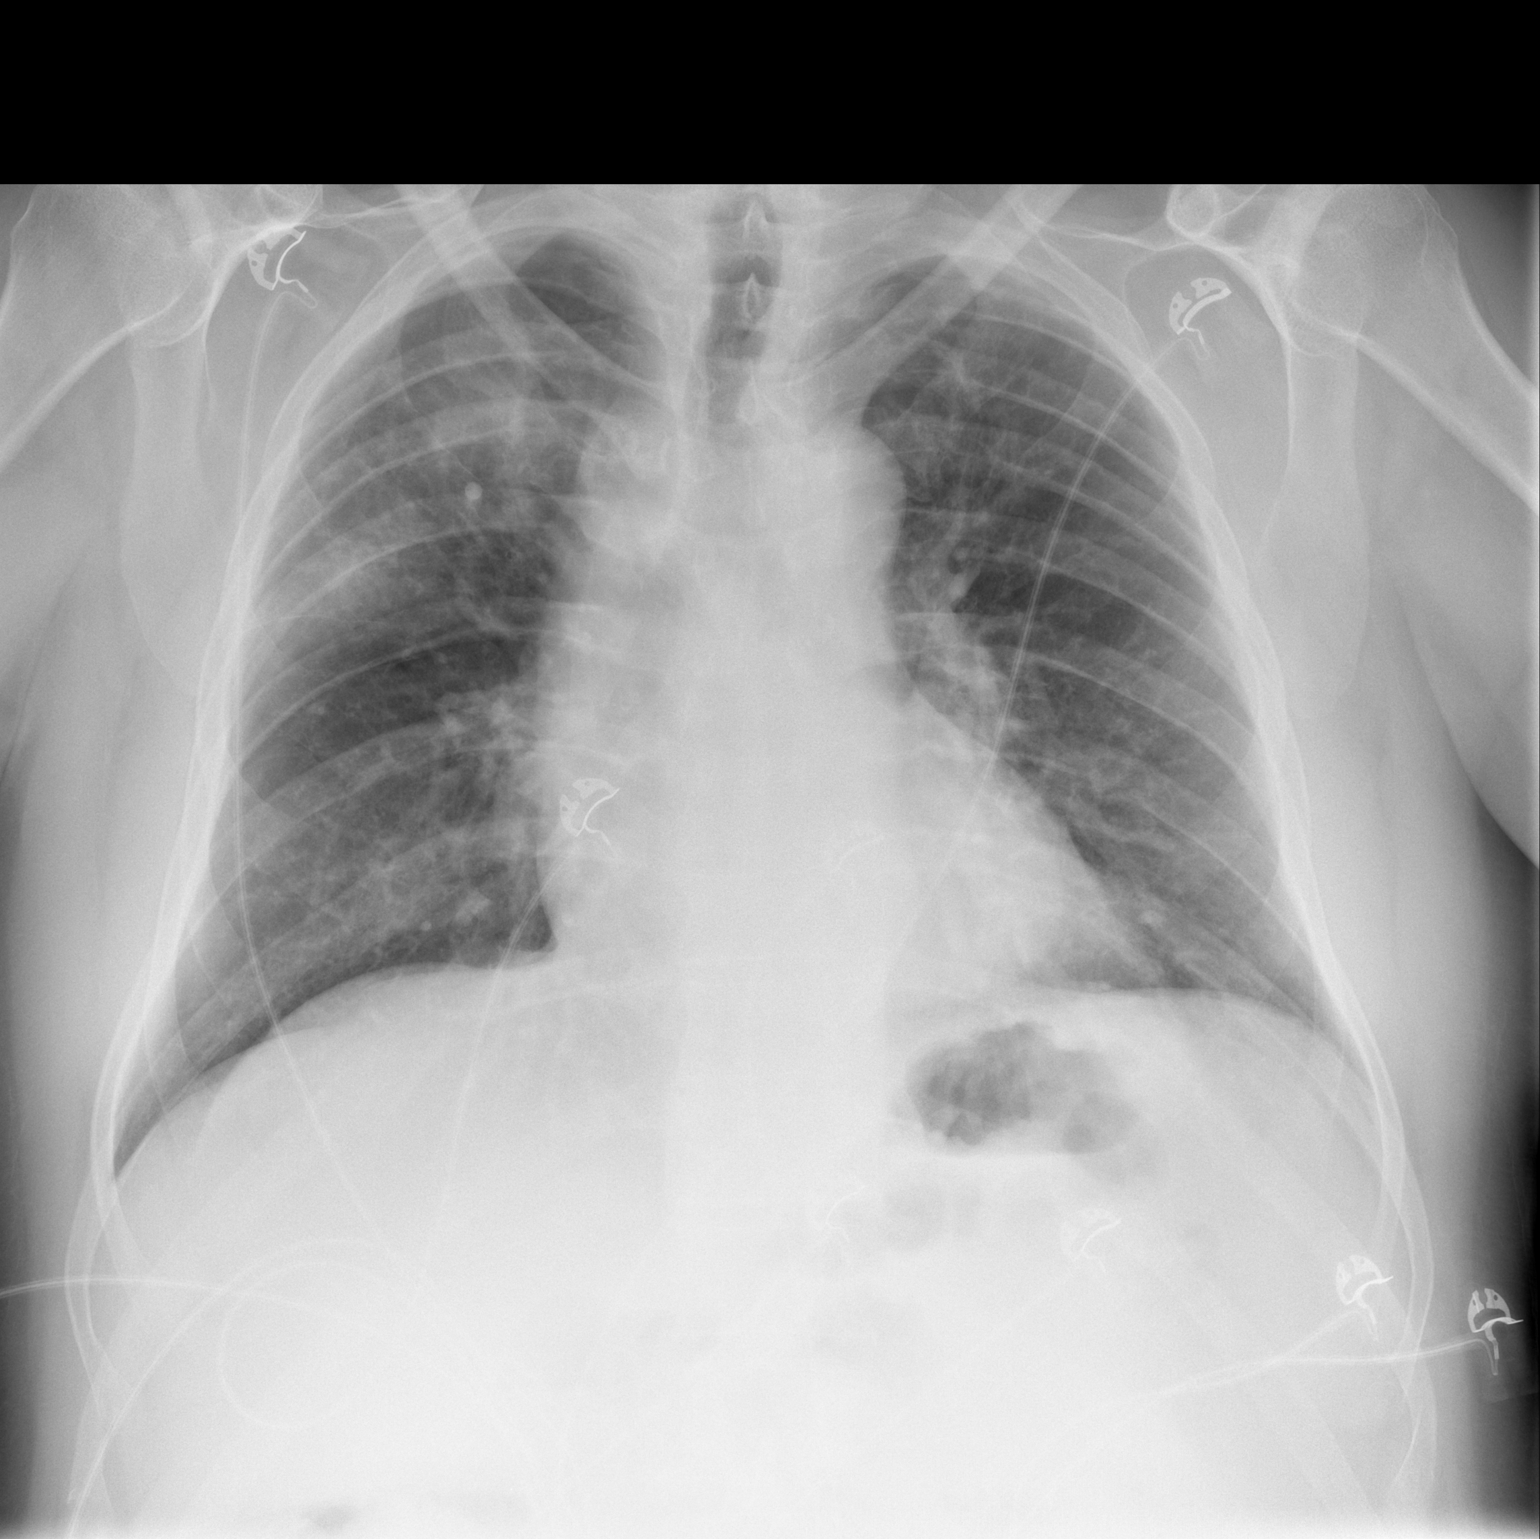

[w chest lat]
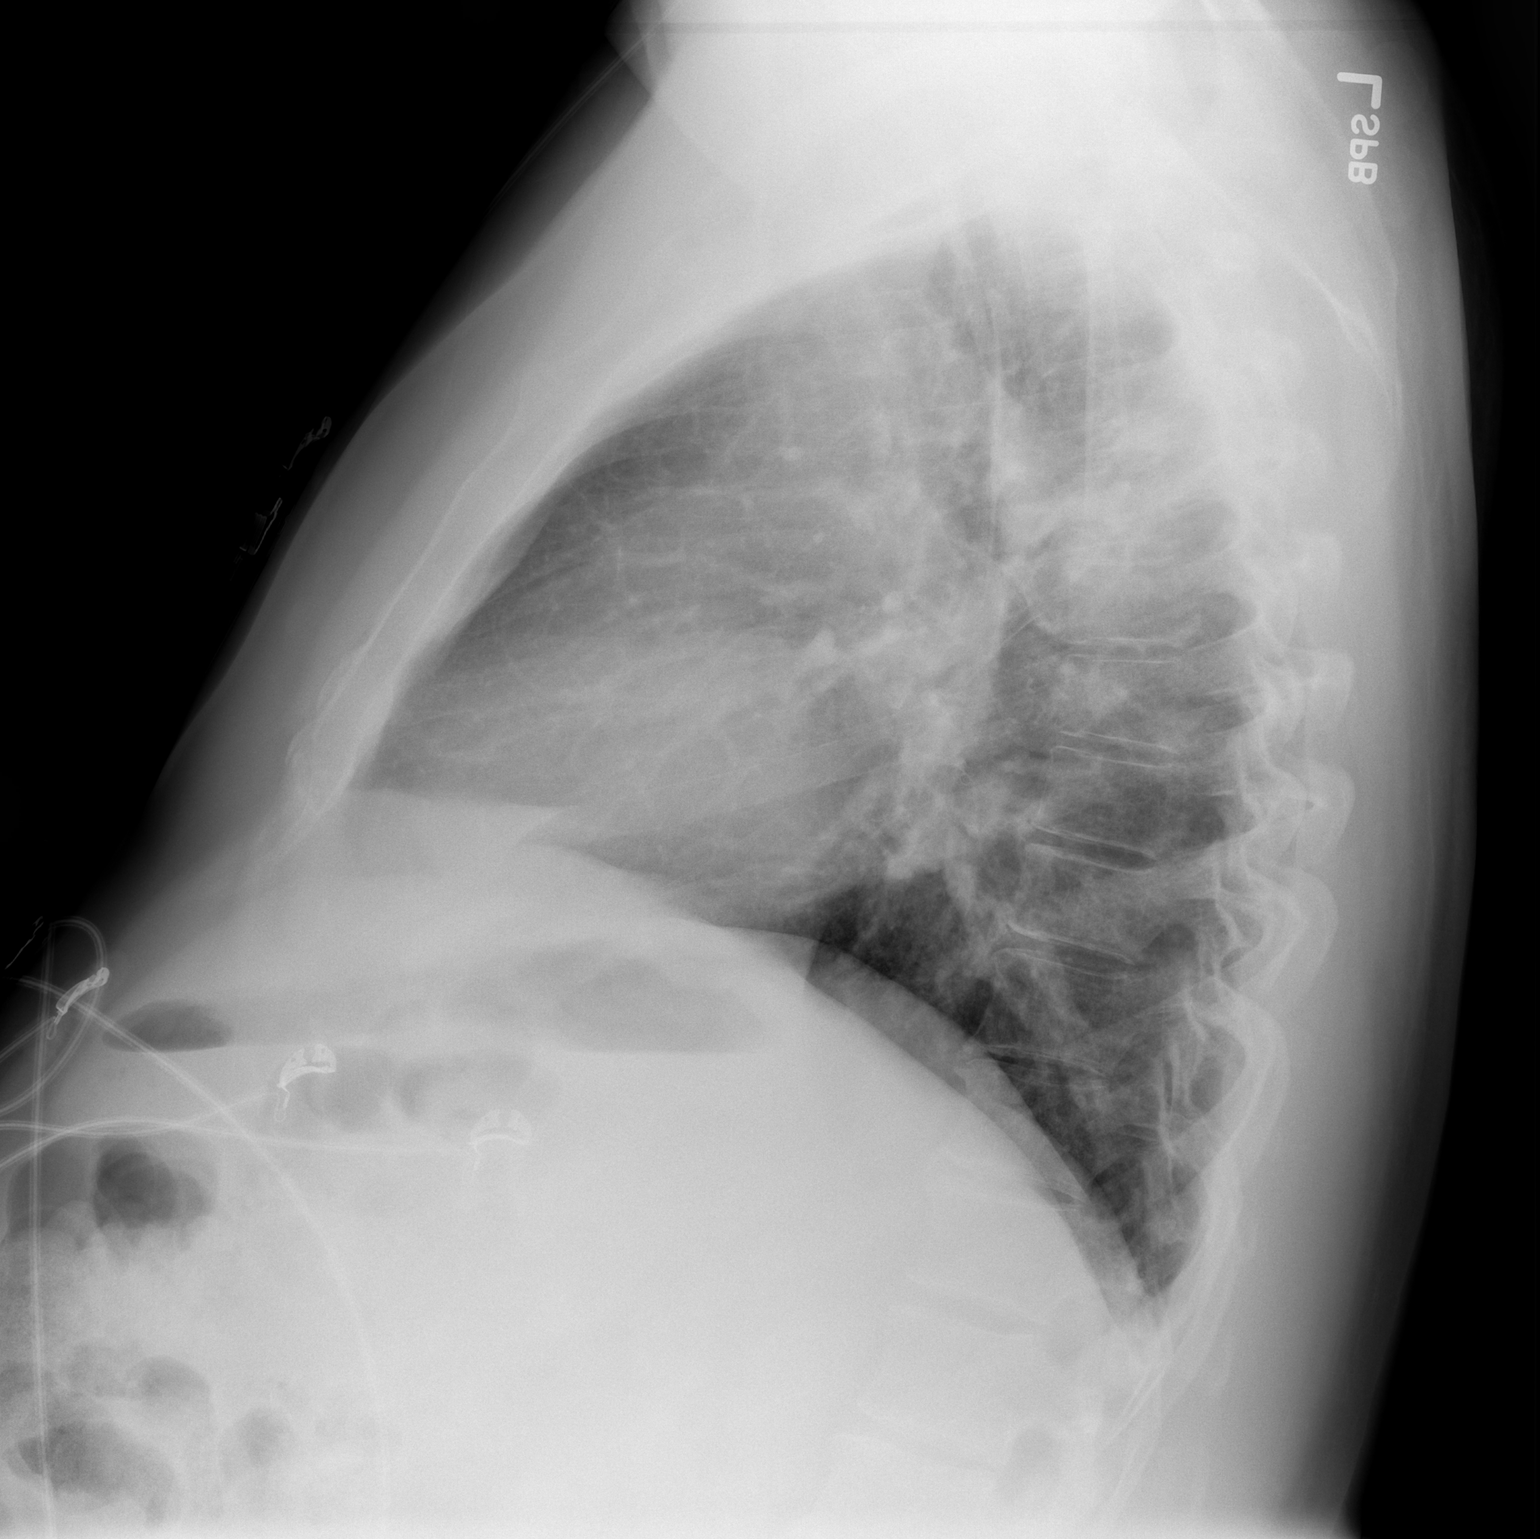

[2 of 2 positions shown; findings below may reference images not displayed]

FINDINGS: There is little change in parenchymal infiltrate in the
right upper lobe consistent with pneumonia.  Otherwise the lungs
are clear.  However, there is slight  prominence of the right
paratracheal soft tissues.  This may be due to suboptimal
inspiration, but adenopathy cannot be excluded and attention to
this area on follow-up chest x-ray or CT of the chest is
recommended.  No effusion is seen.  The heart is mildly enlarged
and stable.  No bony abnormality is noted.
IMPRESSION: 1.  No change in right upper lobe parenchymal opacity consistent
with pneumonia.
2.  Somewhat prominent right paratracheal soft tissues.  Cannot
exclude adenopathy.  Recommend continued follow-up.

## 2011-12-12 ENCOUNTER — Telehealth: Payer: Self-pay | Admitting: Internal Medicine

## 2011-12-12 NOTE — Telephone Encounter (Signed)
lmomtcb x1--need to find out if he wants to pick this up/mail to him

## 2011-12-13 MED ORDER — METHYLPHENIDATE HCL ER (LA) 30 MG PO CP24: 30.0000 mg | ORAL_CAPSULE | Freq: Every day | ORAL | Status: DC

## 2011-12-13 NOTE — Telephone Encounter (Signed)
I spoke with pt and he wanted this mailed to him--confirmed address. rx printed off, signed by cdy, and placed in the out going mail. Pt aware. Will sign off message

## 2012-01-06 ENCOUNTER — Encounter: Payer: Self-pay | Admitting: Cardiology

## 2012-01-11 ENCOUNTER — Encounter: Payer: Self-pay | Admitting: Cardiology

## 2012-03-16 ENCOUNTER — Ambulatory Visit: Payer: BC Managed Care – PPO | Admitting: Internal Medicine

## 2012-11-12 ENCOUNTER — Telehealth: Payer: Self-pay | Admitting: Internal Medicine

## 2012-11-12 DIAGNOSIS — G4733 Obstructive sleep apnea (adult) (pediatric): Secondary | ICD-10-CM

## 2012-11-12 NOTE — Telephone Encounter (Signed)
I spoke with pt and he needs new DME. He needs new mask. He would like a call back with who he will be set up with. Please advise PCC's thanks

## 2012-11-12 NOTE — Telephone Encounter (Signed)
Pt aware order faxed to [facility] for cpap supplies James Zhang

## 2013-01-03 ENCOUNTER — Encounter: Payer: Self-pay | Admitting: Internal Medicine

## 2013-01-03 ENCOUNTER — Other Ambulatory Visit (INDEPENDENT_AMBULATORY_CARE_PROVIDER_SITE_OTHER): Payer: BC Managed Care – PPO

## 2013-01-03 ENCOUNTER — Ambulatory Visit (INDEPENDENT_AMBULATORY_CARE_PROVIDER_SITE_OTHER): Payer: BC Managed Care – PPO | Admitting: Internal Medicine

## 2013-01-03 VITALS — BP 114/60 | HR 78 | Ht 71.0 in | Wt 250.4 lb

## 2013-01-03 DIAGNOSIS — G4733 Obstructive sleep apnea (adult) (pediatric): Secondary | ICD-10-CM

## 2013-01-03 DIAGNOSIS — K117 Disturbances of salivary secretion: Secondary | ICD-10-CM

## 2013-01-03 NOTE — Patient Instructions (Addendum)
Order- DME he needs to get established with [facility], converting from SMS. CPAP 17, need to make sure humidifier works, mask of choice, supplies  Order- lab ANA, sed rate    Dx xerostomia, OSA  Please call as needed

## 2013-01-03 NOTE — Progress Notes (Signed)
Patient ID: James Zhang, male    DOB: 16-Jun-1951, 62 y.o.   MRN: [id number]  HPI 03/16/11- 62 yo former smoker followed for obstructive sleep apnea complicated by allergic rhinitis, GERD Last here 07/19/2010 James Zhang well- feels still sleepy during the day. Wife died, he is alone with no observer. Feels he is sleeping at night ok, w/o waking after sleep onset. He stopped Strattera because of cost, around first of year. That is when the sleepiness started. Continues CPAP 16 all night every night/ SMS He has been on Toprol since 2010. Was on Adderall remotely, but Strattera better helped him "focus".   09/16/11- 62 yo former smoker followed for obstructive sleep apnea complicated by allergic rhinitis, GERD Uses CPAP 18 every night approx 8 hours; started ritalin and wondering if needs higher dose-cant tell a big difference only slight change When he sleeps on his back his mouth gets dry, apparently because he drops his mouth open despite full facemask. We discussed use of humidifier. He is sleepy on first waking and again in the afternoon. He does get outdoors in the morning, walking his dogs, with light exposure. Short sleep onset. Unaware of waking after sleep onset. Asks flu vaccine, even though late- discussed.  01/03/13- 62 yo former smoker followed for obstructive sleep apnea complicated by allergic rhinitis, GERD FOLLOWS FOR: not wearing CPAP every night-mouth stays so dry its hard to use machine. Need to check with [facility] about patient being established with them since had to change from [facility] due to insurance changes. Dry mouth only when he uses CPAP. Tried Biotene. We had increased CPAP from 16-18. If he doesn't wear CPAP, his joints feel sore in the daytime. Now reducing CPAP to 17 to reduce airflow.   Review of Systems- see HPI Constitutional:   No-   weight loss, night sweats, fevers, chills, + fatigue, lassitude. HEENT:   No-  headaches, difficulty swallowing, tooth/dental problems, sore  throat,       No-  sneezing, itching, ear ache, nasal congestion, post nasal drip,  CV:  No-   chest pain, orthopnea, PND, swelling in lower extremities, anasarca, dizziness, palpitations Resp: No-   shortness of breath with exertion or at rest.              No-   productive cough,  No non-productive cough,  No-  coughing up of blood.              No-   change in color of mucus.  No- wheezing.   Skin: No-   rash or lesions. GI:  No-   heartburn, indigestion, abdominal pain, nausea, vomiting,  GU:  MS:  +  joint pain or swelling.  . Neuro- nothing unusual  Psych:  No- change in mood or affect. No depression or anxiety.  No memory loss.    Objective:   Physical Exam General- Alert, Oriented, Affect-appropriate, Distress- none acute  Calm and appropriate, overweight Skin- rash-none, lesions- none, excoriation- none Lymphadenopathy- none Head- atraumatic            Eyes- Gross vision intact, PERRLA, conjunctivae clear secretions            Ears- Hearing, canals normal            Nose- Clear, no-Septal dev, mucus, polyps, erosion, perforation             Throat- Mallampati III , mucosa is not dry now , drainage- none, tonsils- atrophic Neck- flexible , trachea midline,  no stridor , thyroid nl, carotid no bruit Chest - symmetrical excursion , unlabored           Heart/CV- RRR , no murmur , no gallop  , no rub, nl s1 s2                           - JVD- none , edema- none, stasis changes- none, varices- none           Lung- clear to P&A, wheeze- none, cough- none , dullness-none, rub- none           Chest wall-  Abd- Br/ Gen/ Rectal- Not done, not indicated Extrem- cyanosis- none, clubbing, none, atrophy- none, strength- nl Neuro- grossly intact to observation

## 2013-01-13 NOTE — Assessment & Plan Note (Signed)
Not clear why he has much trouble with dryness despite adjusting his humidifier. We discussed mouth breathing. We can look for an inflammatory process. I don't think it's a medication Plan-sedimentation rate, ANA. Reduced CPAP from 18-17 to reduce airflow.

## 2013-02-18 ENCOUNTER — Telehealth: Payer: Self-pay | Admitting: Internal Medicine

## 2013-02-18 DIAGNOSIS — G4733 Obstructive sleep apnea (adult) (pediatric): Secondary | ICD-10-CM

## 2013-02-18 NOTE — Telephone Encounter (Signed)
Last OV 01/03/13. I spoke with the pt and he states that he has begun to have some issues again with his cpap. He states after last OV he did receive a new Quattro mask and it does fit better then the last one. He states that his friend that stayed the night told him that it was making a lot of noise at night, sounded like an airplane. Pt states he also has severe dry mouth to the point where he cannot open his mouth in the mornings for a while. He is using a humidifier with his machine and states it seems to be working fine.  Pt states that his wife passed away while wearing a cpap and she suffered from severe dry mouth as well so this worries him.   Pt also states that over the last month he has lost 15lbs and wonders if this is effecting cpap at all. Pt states someone mentioned to him about going to the sleep center to have mask fitting and he wants to know if this is appropriate for him?  He states he has not worn his Cpap in a while due to these issues and wants to be able to wear it again. Please advise. James Zhang, CMA NKDA

## 2013-02-18 NOTE — Telephone Encounter (Signed)
Spoke with the pt and notified of recs per CDY He verbalized understanding and orders sent to [facility]

## 2013-02-18 NOTE — Telephone Encounter (Signed)
Ok order 1) Referral to daytime Sleep Center Staff for CPAP mask fitting                2) His DME- autotitrate 5-20 cwp x 7 days for pressure recommendation    Dx OSA

## 2013-02-19 ENCOUNTER — Other Ambulatory Visit (HOSPITAL_BASED_OUTPATIENT_CLINIC_OR_DEPARTMENT_OTHER): Payer: BC Managed Care – PPO

## 2013-02-27 ENCOUNTER — Other Ambulatory Visit (HOSPITAL_BASED_OUTPATIENT_CLINIC_OR_DEPARTMENT_OTHER): Payer: BC Managed Care – PPO

## 2013-03-11 ENCOUNTER — Other Ambulatory Visit (HOSPITAL_BASED_OUTPATIENT_CLINIC_OR_DEPARTMENT_OTHER): Payer: BC Managed Care – PPO

## 2013-03-26 ENCOUNTER — Telehealth: Payer: Self-pay | Admitting: Internal Medicine

## 2013-03-26 NOTE — Telephone Encounter (Signed)
I spoke with pt and he would prefer to be on auto. He is also stated when he was having a very dry issue he did stop for few nights. He has since worn it daily. Will forward to CDY as an [place]

## 2013-03-26 NOTE — Telephone Encounter (Signed)
Download has been received. Giving to ashytn to have [facility] read as CDY is off all this week. [place] only sleep physician currently in office. Please advise if any recs KC thanks

## 2013-03-26 NOTE — Telephone Encounter (Signed)
I spoke with James Zhang. She is going to fax the download to triage. Will await download

## 2013-03-26 NOTE — Telephone Encounter (Signed)
His download shows his machine is set on auto, with optimal pressure 10cm.  The last note from Dr. Maple Hudson talked about his cpap pressure of 17.  Since his sleep apnea is well controlled on current setup, will leave on auto if pt is doing well, or can set on 10cm of pressure if he prefers fixed pressure setting.  He did have very poor use of his device, wearing only 17 out of 33 days.  Why is that?

## 2013-03-28 ENCOUNTER — Telehealth: Payer: Self-pay | Admitting: Internal Medicine

## 2013-03-28 DIAGNOSIS — G4733 Obstructive sleep apnea (adult) (pediatric): Secondary | ICD-10-CM

## 2013-03-28 NOTE — Telephone Encounter (Signed)
Ok for his "other machine" to be set on 10cm, and he will have to work other issues out with Dr. Maple Hudson [place]

## 2013-03-28 NOTE — Telephone Encounter (Signed)
Barbaraann Share, MD at 03/26/2013 10:41 AM   Status: Signed            His download shows his machine is set on auto, with optimal pressure 10cm. The last note from Dr. Maple Hudson talked about his cpap pressure of 17. Since his sleep apnea is well controlled on current setup, will leave on auto if pt is doing well, or can set on 10cm of pressure if he prefers fixed pressure setting. He did have very poor use of his device, wearing only 17 out of 33 days. Why is   --  I spoke with pt. He stated last night his machine starting making an "abnoxious noise" from the motor that lasts all night. He called [facility] and was advise Darl Pikes is not in to look at it and she is the only they have there per pt. Not sure if she will be back tomorrow or 12/21/2022. He stated he deceased spouse had the same machine he does expect hers' does not have the auto setting. Per pt the machine works fine. He is leaving 12/21/2022 [place] and will be gone x 1 month. He is needing to get this done ASAP. He is not eligible for a new machine yet bc it has not been 5 years. He is wanting [place] to send an order for him to use the other CPAP he has and change the pressure to 10 CM so he has something to take with him over seas. Then when he gets back he will try to have his current machine looked at. Please advise [place] and CDY is not here and he is scheduled for doc of the day. Also you are only sleep physician in office this AM. thanks

## 2013-03-28 NOTE — Telephone Encounter (Signed)
Order has been placed and pt aware. Nothing further needed 

## 2013-04-30 ENCOUNTER — Other Ambulatory Visit: Payer: Self-pay | Admitting: Orthopedic Surgery

## 2013-05-10 ENCOUNTER — Encounter (HOSPITAL_BASED_OUTPATIENT_CLINIC_OR_DEPARTMENT_OTHER): Payer: Self-pay | Admitting: *Deleted

## 2013-05-10 NOTE — Progress Notes (Signed)
[place] has been in [place]-off htn meds-no labs needed Will bring and use cpap

## 2013-05-13 NOTE — H&P (Signed)
  James Zhang is an 62 y.o. male.   Chief Complaint: c/o chronic pain left lateral elbow HPI: Omeed Zhang is a well known 62 year-old gentleman who presents for evaluation of chronic lateral left elbow pain. Mr. James Zhang is experiencing lateral left elbow pain that was brought on by trimming hedges and doing yard work. He rode a [place] the weekend of 02/02/13 and significantly strained his elbow.  He has difficulty lifting in a pronated or palm down position.     Past Medical History  Diagnosis Date  . Esophageal reflux   . Allergic rhinitis, cause unspecified   . Migraines   . Hypertension     no meds now  . OSA on CPAP     uses a cpap since2009  . Arthritis     Past Surgical History  Procedure Laterality Date  . Tonsillectomy    . Knee surgery  1967    left-open repair age 79yr  . Colonoscopy      Family History  Problem Relation Age of Onset  . Restless legs syndrome Father   . Arrhythmia Father     Pacemaker due to bradycardidia   Social History:  reports that he quit smoking about 42 years ago. He does not have any smokeless tobacco history on file. He reports that  drinks alcohol. He reports that he does not use illicit drugs.  Allergies: No Known Allergies  No prescriptions prior to admission    No results found for this or any previous visit (from the past 48 hour(s)).  No results found.   Pertinent items are noted in HPI.  Height 5\' 11"  (1.803 m), weight 104.327 kg (230 lb).  General appearance: alert Head: Normocephalic, without obvious abnormality Neck: supple, symmetrical, trachea midline Resp: clear to auscultation bilaterally Cardio: regular rate and rhythm GI: normal findings: bowel sounds normal Extremities:  Inspection of his arm reveals no visible deformity.  He has range of motion 0 to 140 degrees.  He is tender at the extensor carpi radialis longus and brevis origin.  He has a positive  middle finger extension test.  He has pain with  resisted wrist extension.  His wrist flexion fist position elbow extended is 60 degrees left vs. 80 right.  He has only 40 degrees flexion at external rotation.   RADIOGRAPHS:    Plain films of his elbow demonstrate a supracondylar process which is an incidental finding.  He does not have new bone growth at the lateral epicondyle, but has slight sclerosis noted.   MRI at [facility] revealed profound epicondylitis with an avulsion of the extensor carpi radialis brevis and fluid outside the tendon, likely joint fluid. Pulses: 2+ and symmetric Skin: normal Neurologic: Grossly normal    Assessment/Plan Impression:Chronic lateral epicondylitis left elbow  Plan:To the OR for ECRB repair left elbow.The procedure, risks,benefits and post-op course were discussed with the patient at length and they were in agreement with the plan. DASNOIT,Roseanne Juenger J 05/13/2013, 1:40 PM   H&P documentation: 05/14/2013  -History and Physical Reviewed  -Patient has been re-examined  -No change in the plan of care  Wyn Forster, MD

## 2013-05-14 ENCOUNTER — Ambulatory Visit (HOSPITAL_BASED_OUTPATIENT_CLINIC_OR_DEPARTMENT_OTHER)
Admission: RE | Admit: 2013-05-14 | Discharge: 2013-05-14 | Disposition: A | Discharge status: Home / Self Care | Payer: BC Managed Care – PPO | Source: Ambulatory Visit | Attending: Orthopedic Surgery | Admitting: Orthopedic Surgery

## 2013-05-14 ENCOUNTER — Encounter (HOSPITAL_BASED_OUTPATIENT_CLINIC_OR_DEPARTMENT_OTHER): Payer: Self-pay | Admitting: Anesthesiology

## 2013-05-14 ENCOUNTER — Ambulatory Visit (HOSPITAL_BASED_OUTPATIENT_CLINIC_OR_DEPARTMENT_OTHER): Payer: BC Managed Care – PPO | Admitting: Anesthesiology

## 2013-05-14 ENCOUNTER — Encounter (HOSPITAL_BASED_OUTPATIENT_CLINIC_OR_DEPARTMENT_OTHER): Payer: Self-pay | Admitting: Orthopedic Surgery

## 2013-05-14 ENCOUNTER — Encounter (HOSPITAL_BASED_OUTPATIENT_CLINIC_OR_DEPARTMENT_OTHER)
Admission: RE | Disposition: A | Discharge status: Home / Self Care | Payer: Self-pay | Source: Ambulatory Visit | Attending: Orthopedic Surgery

## 2013-05-14 DIAGNOSIS — K219 Gastro-esophageal reflux disease without esophagitis: Secondary | ICD-10-CM | POA: Insufficient documentation

## 2013-05-14 DIAGNOSIS — S53499A Other sprain of unspecified elbow, initial encounter: Secondary | ICD-10-CM | POA: Insufficient documentation

## 2013-05-14 DIAGNOSIS — G4733 Obstructive sleep apnea (adult) (pediatric): Secondary | ICD-10-CM | POA: Insufficient documentation

## 2013-05-14 DIAGNOSIS — X58XXXA Exposure to other specified factors, initial encounter: Secondary | ICD-10-CM | POA: Insufficient documentation

## 2013-05-14 DIAGNOSIS — M129 Arthropathy, unspecified: Secondary | ICD-10-CM | POA: Insufficient documentation

## 2013-05-14 DIAGNOSIS — I10 Essential (primary) hypertension: Secondary | ICD-10-CM | POA: Insufficient documentation

## 2013-05-14 HISTORY — DX: Unspecified osteoarthritis, unspecified site: M19.90

## 2013-05-14 HISTORY — PX: TENDON RECONSTRUCTION: SHX2487

## 2013-05-14 HISTORY — DX: Essential (primary) hypertension: I10

## 2013-05-14 SURGERY — RECONSTRUCTION, TENDON OR LIGAMENT, ELBOW
Anesthesia: General | Site: Elbow | Laterality: Left | Wound class: Clean

## 2013-05-14 MED ORDER — CEFAZOLIN SODIUM-DEXTROSE 2-3 GM-% IV SOLR
INTRAVENOUS | Status: DC | PRN
  Administered 2013-05-14: 2 g via INTRAVENOUS

## 2013-05-14 MED ORDER — CEPHALEXIN 500 MG PO CAPS: 500.0000 mg | ORAL_CAPSULE | Freq: Three times a day (TID) | ORAL | Status: AC

## 2013-05-14 MED ORDER — FENTANYL CITRATE 0.05 MG/ML IJ SOLN: 50.0000 ug | INTRAMUSCULAR | Status: DC | PRN

## 2013-05-14 MED ORDER — CHLORHEXIDINE GLUCONATE 4 % EX LIQD: 60.0000 mL | Freq: Once | CUTANEOUS | Status: DC

## 2013-05-14 MED ORDER — FENTANYL CITRATE 0.05 MG/ML IJ SOLN
INTRAMUSCULAR | Status: DC | PRN
  Administered 2013-05-14: 50 ug via INTRAVENOUS
  Administered 2013-05-14 (×2): 25 ug via INTRAVENOUS

## 2013-05-14 MED ORDER — HYDROMORPHONE HCL 2 MG PO TABS: 2.0000 mg | ORAL_TABLET | ORAL | Status: AC | PRN

## 2013-05-14 MED ORDER — KETOROLAC TROMETHAMINE 30 MG/ML IJ SOLN
INTRAMUSCULAR | Status: DC | PRN
  Administered 2013-05-14: 30 mg via INTRAVENOUS

## 2013-05-14 MED ORDER — LIDOCAINE HCL 2 % IJ SOLN
INTRAMUSCULAR | Status: DC | PRN
  Administered 2013-05-14: 3 mL

## 2013-05-14 MED ORDER — MIDAZOLAM HCL 2 MG/2ML IJ SOLN: 1.0000 mg | INTRAMUSCULAR | Status: DC | PRN

## 2013-05-14 MED ORDER — HYDROMORPHONE HCL PF 1 MG/ML IJ SOLN: 0.2500 mg | INTRAMUSCULAR | Status: DC | PRN

## 2013-05-14 MED ORDER — LIDOCAINE HCL (CARDIAC) 20 MG/ML IV SOLN
INTRAVENOUS | Status: DC | PRN
  Administered 2013-05-14: 80 mg via INTRAVENOUS

## 2013-05-14 MED ORDER — ONDANSETRON HCL 4 MG/2ML IJ SOLN
INTRAMUSCULAR | Status: DC | PRN
  Administered 2013-05-14: 4 mg via INTRAMUSCULAR

## 2013-05-14 MED ORDER — MIDAZOLAM HCL 5 MG/5ML IJ SOLN
INTRAMUSCULAR | Status: DC | PRN
  Administered 2013-05-14: 2 mg via INTRAVENOUS

## 2013-05-14 MED ORDER — DEXAMETHASONE SODIUM PHOSPHATE 4 MG/ML IJ SOLN
INTRAMUSCULAR | Status: DC | PRN
  Administered 2013-05-14: 10 mg via INTRAVENOUS

## 2013-05-14 MED ORDER — PROPOFOL 10 MG/ML IV BOLUS
INTRAVENOUS | Status: DC | PRN
  Administered 2013-05-14: 50 mg via INTRAVENOUS
  Administered 2013-05-14: 200 mg via INTRAVENOUS

## 2013-05-14 MED ORDER — OXYCODONE HCL 5 MG/5ML PO SOLN: 5.0000 mg | Freq: Once | ORAL | Status: AC | PRN

## 2013-05-14 MED ORDER — LACTATED RINGERS IV SOLN
INTRAVENOUS | Status: DC
  Administered 2013-05-14 (×2): via INTRAVENOUS

## 2013-05-14 MED ORDER — OXYCODONE HCL 5 MG PO TABS
5.0000 mg | ORAL_TABLET | Freq: Once | ORAL | Status: AC | PRN
  Administered 2013-05-14: 5 mg via ORAL

## 2013-05-14 MED ORDER — ONDANSETRON HCL 4 MG/2ML IJ SOLN: 4.0000 mg | Freq: Four times a day (QID) | INTRAMUSCULAR | Status: DC | PRN

## 2013-05-14 SURGICAL SUPPLY — 85 items
BANDAGE ADHESIVE 1X3 (GAUZE/BANDAGES/DRESSINGS) IMPLANT
BANDAGE CONFORM 3  STR LF (GAUZE/BANDAGES/DRESSINGS) ×2 IMPLANT
BANDAGE ELASTIC 3 VELCRO ST LF (GAUZE/BANDAGES/DRESSINGS) ×2 IMPLANT
BANDAGE ELASTIC 4 VELCRO ST LF (GAUZE/BANDAGES/DRESSINGS) ×2 IMPLANT
BANDAGE GAUZE ELAST BULKY 4 IN (GAUZE/BANDAGES/DRESSINGS) IMPLANT
BLADE MINI RND TIP GREEN BEAV (BLADE) ×2 IMPLANT
BLADE SURG 15 STRL LF DISP TIS (BLADE) ×1 IMPLANT
BLADE SURG 15 STRL SS (BLADE) ×1
BNDG COHESIVE 3X5 TAN STRL LF (GAUZE/BANDAGES/DRESSINGS) ×2 IMPLANT
BNDG COHESIVE 4X5 TAN STRL (GAUZE/BANDAGES/DRESSINGS) IMPLANT
BNDG ESMARK 4X9 LF (GAUZE/BANDAGES/DRESSINGS) ×2 IMPLANT
BRUSH SCRUB EZ PLAIN DRY (MISCELLANEOUS) ×2 IMPLANT
CLOTH BEACON ORANGE TIMEOUT ST (SAFETY) ×2 IMPLANT
CORDS BIPOLAR (ELECTRODE) ×2 IMPLANT
COVER MAYO STAND STRL (DRAPES) ×2 IMPLANT
COVER TABLE BACK 60X90 (DRAPES) ×2 IMPLANT
CUFF TOURNIQUET SINGLE 18IN (TOURNIQUET CUFF) ×2 IMPLANT
DECANTER SPIKE VIAL GLASS SM (MISCELLANEOUS) ×2 IMPLANT
DRAIN PENROSE 1/4X12 LTX STRL (WOUND CARE) IMPLANT
DRAPE EXTREMITY T 121X128X90 (DRAPE) ×2 IMPLANT
DRAPE INCISE IOBAN 66X45 STRL (DRAPES) IMPLANT
DRAPE OEC MINIVIEW 54X84 (DRAPES) IMPLANT
DRAPE SURG 17X23 STRL (DRAPES) ×2 IMPLANT
DRSG PAD ABDOMINAL 8X10 ST (GAUZE/BANDAGES/DRESSINGS) IMPLANT
DRSG TEGADERM 2-3/8X2-3/4 SM (GAUZE/BANDAGES/DRESSINGS) ×2 IMPLANT
DRSG TEGADERM 4X4.75 (GAUZE/BANDAGES/DRESSINGS) IMPLANT
GAUZE SPONGE 4X4 16PLY XRAY LF (GAUZE/BANDAGES/DRESSINGS) IMPLANT
GAUZE XEROFORM 1X8 LF (GAUZE/BANDAGES/DRESSINGS) IMPLANT
GLOVE BIO SURGEON STRL SZ 6.5 (GLOVE) ×2 IMPLANT
GLOVE BIOGEL M STRL SZ7.5 (GLOVE) IMPLANT
GLOVE BIOGEL PI IND STRL 7.0 (GLOVE) ×1 IMPLANT
GLOVE BIOGEL PI INDICATOR 7.0 (GLOVE) ×1
GLOVE EXAM NITRILE EXT CUFF MD (GLOVE) ×2 IMPLANT
GLOVE ORTHO TXT STRL SZ7.5 (GLOVE) ×2 IMPLANT
GOWN BRE IMP PREV XXLGXLNG (GOWN DISPOSABLE) ×2 IMPLANT
GOWN PREVENTION PLUS XLARGE (GOWN DISPOSABLE) ×2 IMPLANT
K-WIRE .035X4 (WIRE) ×2 IMPLANT
K-WIRE .045X4 (WIRE) IMPLANT
K-WIRE .062X4 (WIRE) IMPLANT
KIT BIO-TENODESIS 3X8 DISP (MISCELLANEOUS)
KIT INSRT BABSR STRL DISP BTN (MISCELLANEOUS) IMPLANT
NDL SUT 6 .5 CRC .975X.05 MAYO (NEEDLE) IMPLANT
NEEDLE 27GAX1X1/2 (NEEDLE) ×2 IMPLANT
NEEDLE FISTULA 1/2 CIRCLE (NEEDLE) IMPLANT
NEEDLE KEITH (NEEDLE) IMPLANT
NEEDLE KEITH SZ10 STRAIGHT (NEEDLE) ×2 IMPLANT
NEEDLE MAYO TAPER (NEEDLE)
PACK BASIN DAY SURGERY FS (CUSTOM PROCEDURE TRAY) ×2 IMPLANT
PAD CAST 3X4 CTTN HI CHSV (CAST SUPPLIES) ×1 IMPLANT
PAD CAST 4YDX4 CTTN HI CHSV (CAST SUPPLIES) IMPLANT
PADDING CAST ABS 4INX4YD NS (CAST SUPPLIES) ×1
PADDING CAST ABS COTTON 4X4 ST (CAST SUPPLIES) ×1 IMPLANT
PADDING CAST COTTON 3X4 STRL (CAST SUPPLIES) ×1
PADDING CAST COTTON 4X4 STRL (CAST SUPPLIES)
PASSER SUT SWANSON 36MM LOOP (INSTRUMENTS) IMPLANT
SLEEVE SCD COMPRESS KNEE MED (MISCELLANEOUS) ×2 IMPLANT
SLING ARM FOAM STRAP LRG (SOFTGOODS) IMPLANT
SLING ARM FOAM STRAP XLG (SOFTGOODS) ×2 IMPLANT
SPLINT PLASTER CAST XFAST 3X15 (CAST SUPPLIES) ×10 IMPLANT
SPLINT PLASTER XTRA FASTSET 3X (CAST SUPPLIES) ×10
SPONGE GAUZE 4X4 12PLY (GAUZE/BANDAGES/DRESSINGS) ×2 IMPLANT
STOCKINETTE 4X48 STRL (DRAPES) ×2 IMPLANT
STRIP CLOSURE SKIN 1/2X4 (GAUZE/BANDAGES/DRESSINGS) ×2 IMPLANT
SUT ETHIBOND 2 OS 4 DA (SUTURE) IMPLANT
SUT FIBERWIRE #2 38 T-5 BLUE (SUTURE) ×4
SUT FIBERWIRE 2-0 18 17.9 3/8 (SUTURE)
SUT FIBERWIRE 3-0 18 TAPR NDL (SUTURE)
SUT PROLENE 3 0 PS 2 (SUTURE) IMPLANT
SUT SILK 2 0 FS (SUTURE) IMPLANT
SUT VIC AB 2-0 SH 27 (SUTURE)
SUT VIC AB 2-0 SH 27XBRD (SUTURE) IMPLANT
SUT VIC AB 3-0 SH 27 (SUTURE) ×1
SUT VIC AB 3-0 SH 27X BRD (SUTURE) ×1 IMPLANT
SUT VIC AB 4-0 P-3 18XBRD (SUTURE) IMPLANT
SUT VIC AB 4-0 P3 18 (SUTURE)
SUT VICRYL 4-0 PS2 18IN ABS (SUTURE) IMPLANT
SUTURE FIBERWR #2 38 T-5 BLUE (SUTURE) ×2 IMPLANT
SUTURE FIBERWR 2-0 18 17.9 3/8 (SUTURE) IMPLANT
SUTURE FIBERWR 3-0 18 TAPR NDL (SUTURE) IMPLANT
SYR 3ML 23GX1 SAFETY (SYRINGE) IMPLANT
SYR BULB 3OZ (MISCELLANEOUS) ×2 IMPLANT
SYR CONTROL 10ML LL (SYRINGE) ×2 IMPLANT
TOWEL OR 17X24 6PK STRL BLUE (TOWEL DISPOSABLE) ×4 IMPLANT
TUBE CONNECTING 20X1/4 (TUBING) IMPLANT
UNDERPAD 30X30 INCONTINENT (UNDERPADS AND DIAPERS) ×2 IMPLANT

## 2013-05-14 NOTE — Anesthesia Preprocedure Evaluation (Signed)
Anesthesia Evaluation  Patient identified by MRN, date of birth, ID band Patient awake    Reviewed: Allergy & Precautions, H&P , NPO status , Patient's Chart, lab work & pertinent test results  Airway Mallampati: II  Neck ROM: full    Dental   Pulmonary sleep apnea ,          Cardiovascular hypertension,     Neuro/Psych  Headaches,    GI/Hepatic GERD-  ,  Endo/Other  Obese   Renal/GU      Musculoskeletal   Abdominal   Peds  Hematology   Anesthesia Other Findings   Reproductive/Obstetrics                           Anesthesia Physical Anesthesia Plan  ASA: II  Anesthesia Plan: General   Post-op Pain Management:    Induction: Intravenous  Airway Management Planned: LMA  Additional Equipment:   Intra-op Plan:   Post-operative Plan:   Informed Consent:   Plan Discussed with: CRNA, Anesthesiologist and Surgeon  Anesthesia Plan Comments:         Anesthesia Quick Evaluation

## 2013-05-14 NOTE — Op Note (Signed)
[id number] 

## 2013-05-14 NOTE — Transfer of Care (Signed)
Immediate Anesthesia Transfer of Care Note  Patient: James Zhang  Procedure(s) Performed: Procedure(s): EXTENSOR CARPI RADIALUS BREVIS REPAIR LEFT ELBOW (Left)  Patient Location: PACU  Anesthesia Type:General  Level of Consciousness: sedated  Airway & Oxygen Therapy: Patient Spontanous Breathing and Patient connected to face mask oxygen  Post-op Assessment: Report given to PACU RN and Post -op Vital signs reviewed and stable  Post vital signs: Reviewed and stable  Complications: No apparent anesthesia complications

## 2013-05-14 NOTE — Anesthesia Procedure Notes (Signed)
Procedure Name: LMA Insertion Performed by: Briant Sites Pre-anesthesia Checklist: Patient identified, Emergency Drugs available, Suction available and Patient being monitored Patient Re-evaluated:Patient Re-evaluated prior to inductionOxygen Delivery Method: Circle System Utilized Preoxygenation: Pre-oxygenation with 100% oxygen Intubation Type: IV induction Ventilation: Mask ventilation without difficulty LMA: LMA inserted LMA Size: 5.0 Number of attempts: 1 Airway Equipment and Method: bite block Placement Confirmation: positive ETCO2 Tube secured with: Tape Dental Injury: Teeth and Oropharynx as per pre-operative assessment

## 2013-05-14 NOTE — Brief Op Note (Signed)
05/14/2013  12:00 PM  PATIENT:  James Zhang  62 y.o. male  PRE-OPERATIVE DIAGNOSIS:  LEFT ECRB TEAR  POST-OPERATIVE DIAGNOSIS:  LEFT ECRB TEAR  PROCEDURE:  Procedure(s): EXTENSOR CARPI RADIALUS BREVIS REPAIR LEFT ELBOW (Left)  SURGEON:  Surgeon(s) and Role:    * Wyn Forster., MD - Primary  PHYSICIAN ASSISTANT:   ASSISTANTS: nurse   ANESTHESIA:   general  EBL:  Total I/O In: 1000 [I.V.:1000] Out: -   BLOOD ADMINISTERED:none  DRAINS: none   LOCAL MEDICATIONS USED:  LIDOCAINE   SPECIMEN:  No Specimen  DISPOSITION OF SPECIMEN:  N/A  COUNTS:  YES  TOURNIQUET:   Total Tourniquet Time Documented: Upper Arm (Left) - 45 minutes Total: Upper Arm (Left) - 45 minutes   DICTATION: .Other Dictation: Dictation Number [id number]  PLAN OF CARE: Discharge to home after PACU  PATIENT DISPOSITION:  PACU - hemodynamically stable.   Delay start of Pharmacological VTE agent (>24hrs) due to surgical blood loss or risk of bleeding: not applicable

## 2013-05-15 ENCOUNTER — Encounter (HOSPITAL_BASED_OUTPATIENT_CLINIC_OR_DEPARTMENT_OTHER): Payer: Self-pay | Admitting: Orthopedic Surgery

## 2013-05-15 NOTE — Op Note (Signed)
NAMEGIBSON, LAD NO.:  [id number]  MEDICAL RECORD NO.:  [id number]  LOCATION:                               FACILITY:  [facility]  PHYSICIAN:  Katy Fitch. Celica Kotowski, M.D. 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  OPERATION:  Reconstruction of common extensor origin specifically extensor carpi radialis longus and brevis origin to decorticated and drilled lateral epicondyle left elbow.  OPERATING SURGEON:  Katy Fitch. Ronisha Herringshaw, MD  ASSISTANT:  Nurse.  ANESTHESIA:  General by LMA.  SUPERVISING ANESTHESIOLOGIST:  Dr. Judie Petit.  INDICATIONS:  James Zhang is a 62 year old retired gentleman well acquainted with our practice.  He presented for evaluation of a painful left elbow more than 3 months ago.  He was noted to have significant tightness of the extensor carpi radialis longus and brevis, and had typical findings of chronic lateral epicondylitis.  We tried a 3 month course of conservative management, including stretching, activity modification, and deep friction massage/ice.  He did not improve and was never able to completely relieve his extensor carpi radialis brevis tightness.  After he had symptoms over 6 months, he was sent for an MRI.  The MRI revealed an avulsion of the extensor carpi radialis brevis from the epicondyle.  We advised Mr. Maye to undergo debridement of his tendinosis, drilling of the epicondyle and repair of the extensor carpi radialis longus and brevis to the lateral humeral epicondyle.  After informed consent, he was  brought to the operating room at this time.  PROCEDURE:  Egor Fullilove was interviewed in the holding area by Dr. Randa Evens of Anesthesia.  General anesthesia by LMA technique was recommended and accepted.  I also interviewed him and I discussed the surgery, anticipated aftercare, potential risks and benefits and discussed perioperative use of pain medication and antibiotics.  After questions were invited and answered in detail, his left arm was marked as the proper surgical site per our identification protocol with a marking pen.  He was then transferred to room 2 of the [facility] where under Dr. Randa Evens' direct supervision general anesthesia by LMA technique was induced followed by routine Betadine scrub and paint of the left upper extremity.  The arm was exsanguinated with Esmarch bandage and an arterial tourniquet on the proximal brachium was inflated to 220 mmHg.  A 2 g of Ancef administered as an IV prophylactic antibiotic by Anesthesia preoperatively.  Following routine surgical time-out, procedure commenced with a curvilinear incision just posterior to the epicondyle.  Subcutaneous tissues were carefully divided taking care to identify the fascia overlying the anconeus and extensor carpi radialis longus.  This was incised revealing the common sensor origin.  There was a bit of neovascularization noted in the extensor carpi radialis longus.  A Beaver blade was used to elevate the extensor carpi radialis longus off the  epicondyle and a large ridge of reactive bone noted.  The extensor carpi radialis brevis was hyalinized and highly vascular.  The extensor carpi radialis brevis was released and the bone was debrided with a rongeur. The bone was then drilled approximately 30 times with a 0.035 inch Kirschner wire, followed by creation of 3 bone tunnels.  A pair of mattress sutures were placed at the distal aspect of the foot print of the extensor carpi radialis  brevis, brought through bone and an over-the-top mattress inset suture was used to create an excellent compressed footprint.  Anatomic repair of the extensor carpi radius longus and brevis was accomplished with the wrist in extension and the elbow in extension.  The fascia was then repaired anatomically with a running 3-0 Vicryl followed by irrigation of the wound and repair of the skin with subcutaneous 3-0 Vicryl and intradermal 3-0 Prolene.  Mr. Stefanski was placed in a compressive dressing with a wrist splint maintaining the wrist in 30 degrees of dorsiflexion.  His elbow was dressed with sterile gauze and Tegaderm.  A 2% lidocaine was infiltrated along the wound margins and around the epicondyle for perioperative comfort.  He was also provided Toradol IV in the OR for comfort.  For aftercare, he will be provided prescriptions for Dilaudid 2 mg 1 or 2 tablets p.o. q.4-6 hours p.r.n. pain, 30 tablets without refill, also Keflex 500 mg 1 p.o. q.8 hours x4 days as a prophylactic antibiotic.      Katy Fitch Parminder Trapani, M.D.   ______________________________ Katy Fitch. Jeane Cashatt, M.D.    RVS/MEDQ  D:  05/14/2013  T:  05/15/2013  Job:  [id number]  cc:   Marjory Lies, M.D.

## 2013-05-27 NOTE — Anesthesia Postprocedure Evaluation (Signed)
Anesthesia Post Note  Patient: James Zhang  Procedure(s) Performed: Procedure(s) (LRB): EXTENSOR CARPI RADIALUS BREVIS REPAIR LEFT ELBOW (Left)  Anesthesia type: General  Patient location: PACU  Post pain: Pain level controlled and Adequate analgesia  Post assessment: Post-op Vital signs reviewed, Patient's Cardiovascular Status Stable, Respiratory Function Stable, Patent Airway and Pain level controlled  Last Vitals:  Filed Vitals:   05/14/13 1315  BP: 133/67  Pulse: 60  Temp: 36.7 C  Resp: 16    Post vital signs: Reviewed and stable  Level of consciousness: awake, alert  and oriented  Complications: No apparent anesthesia complications

## 2013-06-13 ENCOUNTER — Other Ambulatory Visit: Payer: Self-pay

## 2013-07-01 ENCOUNTER — Telehealth: Payer: Self-pay | Admitting: Internal Medicine

## 2013-07-01 DIAGNOSIS — G4733 Obstructive sleep apnea (adult) (pediatric): Secondary | ICD-10-CM

## 2013-07-01 NOTE — Telephone Encounter (Signed)
Ok to order - [facility] assist patient change DME from [facility] as requested. Replacement for old CPAP machine, lot working right and 62 yrs old. 17 cwp, mask of choice, humidifier, supplies   Dx OSA

## 2013-07-01 NOTE — Telephone Encounter (Signed)
Spoke with the pt and notified of recs per CDY  Pt verbalized understanding and nothing further needed Orders sent to [facility]

## 2013-07-01 NOTE — Telephone Encounter (Signed)
I called and spoke with pt. He reports he believes he has had CPAP x 5 years. He has resmed escape. It is making a high pitched noise but keeps partner awake. Doesn't believe it used to do this. He changed the mask and still doing same noise. He called [facility] and has talked to them about machine and also called [facility]. He reports [facility] does not seem very knowledgeable about the machine.  Pt is wanting to know if he can get an order for a new machine. Pt is also may want to change DME's as well. He doesn't feel [facility] handles anything with efficiency. Please advise Dr. Maple Hudson thanks

## 2014-01-10 ENCOUNTER — Ambulatory Visit: Payer: BC Managed Care – PPO | Admitting: Internal Medicine
